# Patient Record
Sex: Female | Born: 1950 | Race: White | Hispanic: No | Marital: Married | Smoking: Never smoker
Health system: Southern US, Community
[De-identification: ages and names within clinical notes are randomized; demographics above are authoritative.]

## PROBLEM LIST (undated history)

## (undated) DIAGNOSIS — R011 Cardiac murmur, unspecified: Secondary | ICD-10-CM

## (undated) DIAGNOSIS — C4491 Basal cell carcinoma of skin, unspecified: Secondary | ICD-10-CM

## (undated) DIAGNOSIS — B019 Varicella without complication: Secondary | ICD-10-CM

## (undated) DIAGNOSIS — C439 Malignant melanoma of skin, unspecified: Secondary | ICD-10-CM

## (undated) HISTORY — DX: Basal cell carcinoma of skin, unspecified: C44.91

## (undated) HISTORY — DX: Varicella without complication: B01.9

## (undated) HISTORY — PX: OTHER SURGICAL HISTORY: SHX169

## (undated) HISTORY — PX: MOHS SURGERY: SUR867

## (undated) HISTORY — DX: Malignant melanoma of skin, unspecified: C43.9

## (undated) HISTORY — DX: Cardiac murmur, unspecified: R01.1

---
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
## 1998-12-03 ENCOUNTER — Other Ambulatory Visit: Admission: RE | Admit: 1998-12-03 | Discharge: 1998-12-03 | Payer: Self-pay | Admitting: Obstetrics and Gynecology

## 2000-02-07 ENCOUNTER — Other Ambulatory Visit: Admission: RE | Admit: 2000-02-07 | Discharge: 2000-02-07 | Payer: Self-pay | Admitting: Obstetrics and Gynecology

## 2001-02-04 ENCOUNTER — Other Ambulatory Visit: Admission: RE | Admit: 2001-02-04 | Discharge: 2001-02-04 | Payer: Self-pay | Admitting: Obstetrics and Gynecology

## 2002-03-03 ENCOUNTER — Other Ambulatory Visit: Admission: RE | Admit: 2002-03-03 | Discharge: 2002-03-03 | Payer: Self-pay | Admitting: Obstetrics and Gynecology

## 2008-04-20 ENCOUNTER — Encounter: Admission: RE | Admit: 2008-04-20 | Discharge: 2008-04-20 | Payer: Self-pay | Admitting: General Surgery

## 2009-03-26 IMAGING — US US SOFT TISSUE HEAD/NECK
1 series · 13 of 25 positions shown · non-contrast
Comparison: NONE

CLINICAL DATA: Thyromegaly. 

THYROID ULTRASOUND

[Series 1: us thyroid · 0.05mm/px · 13 of 31 slices shown]
[im 1/31]
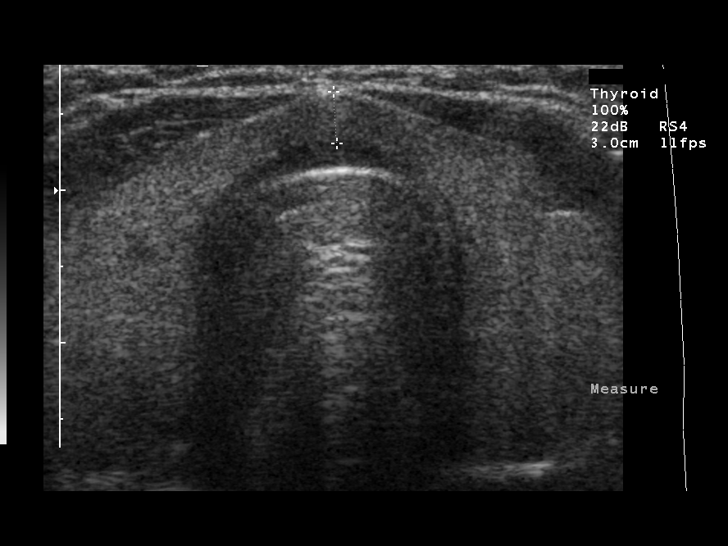
[im 3/31]
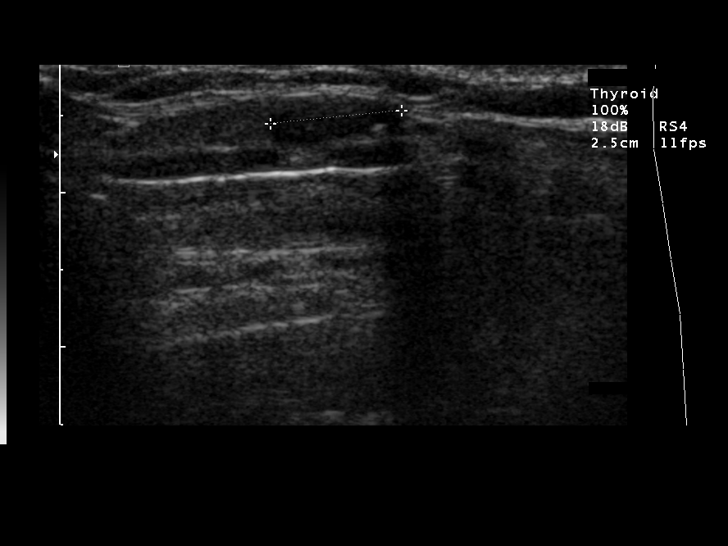
[im 6/31]
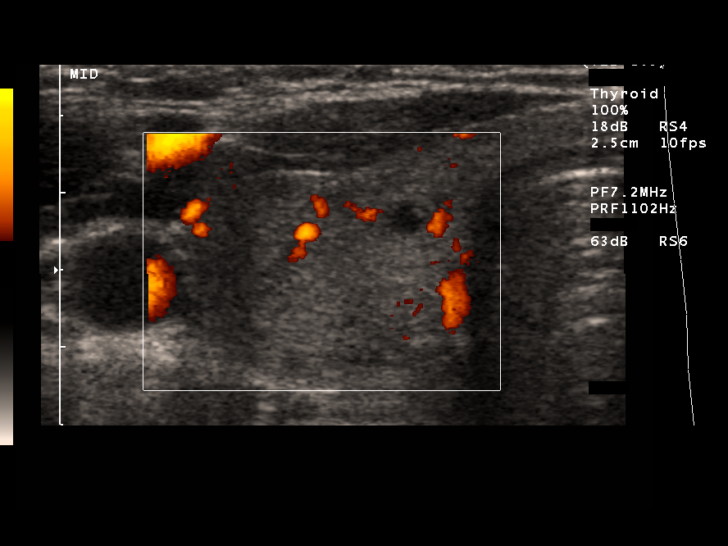
[im 8/31]
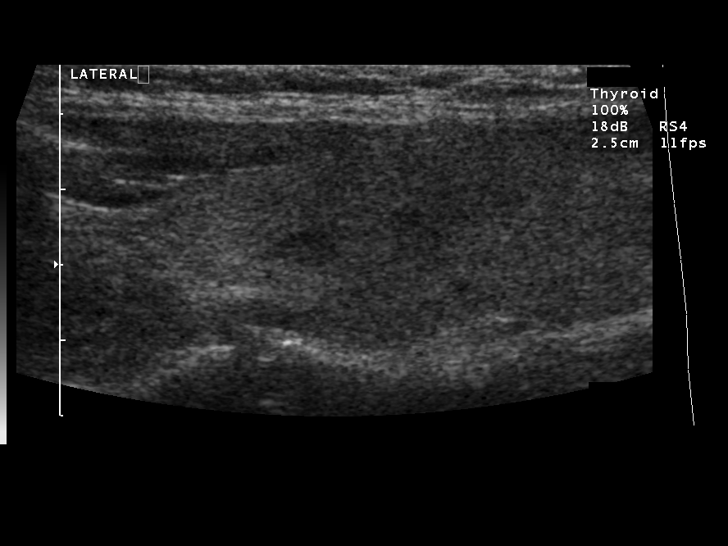
[im 11/31]
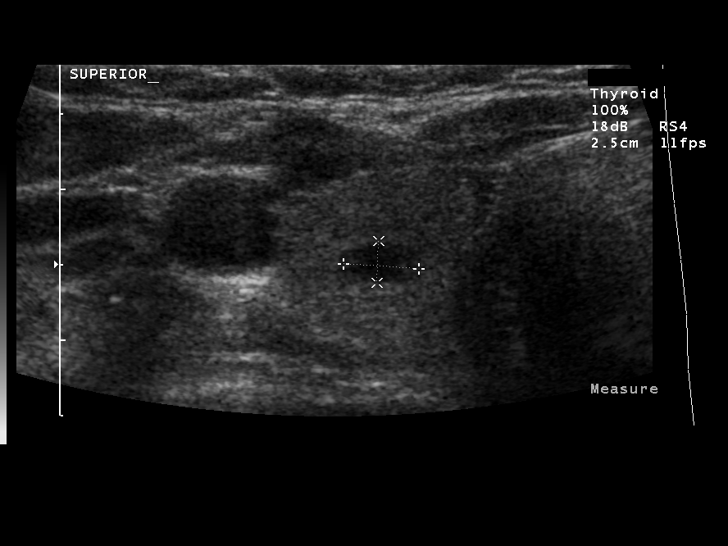
[im 13/31]
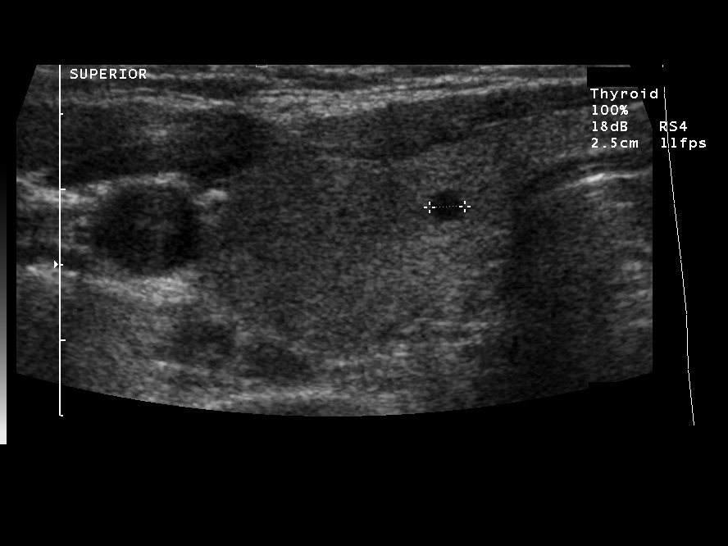
[im 16/31]
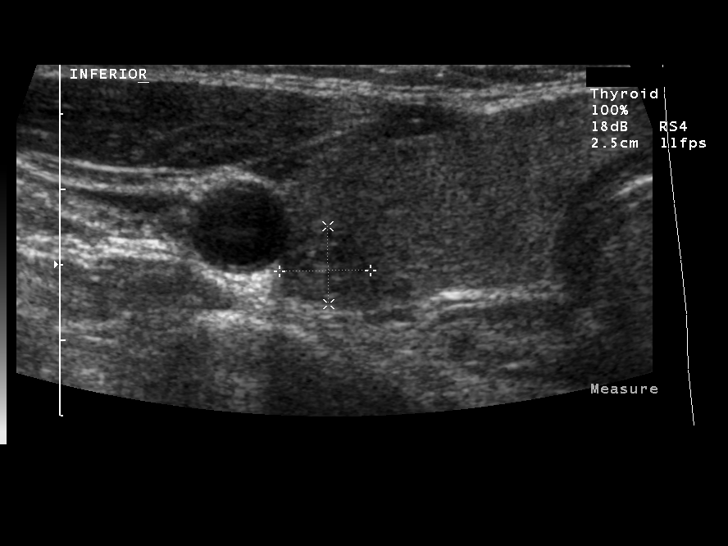
[im 18/31]
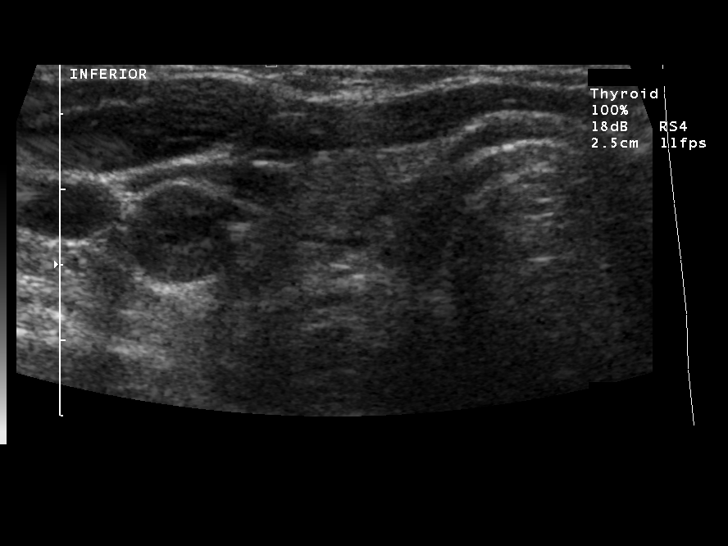
[im 21/31]
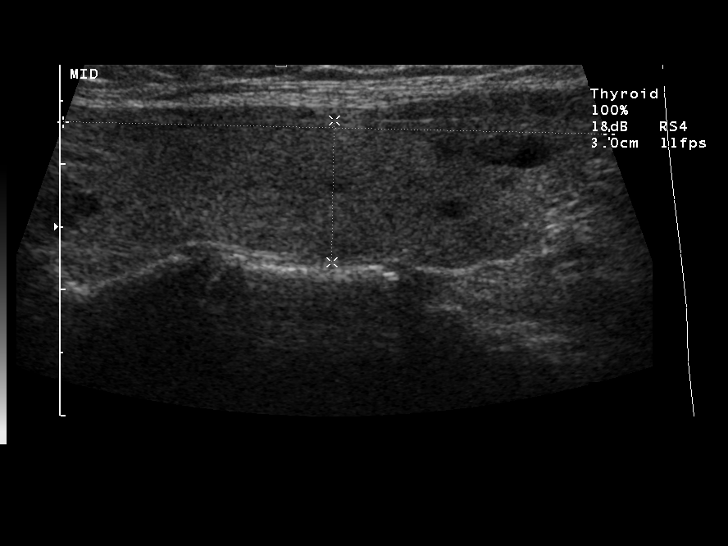
[im 23/31]
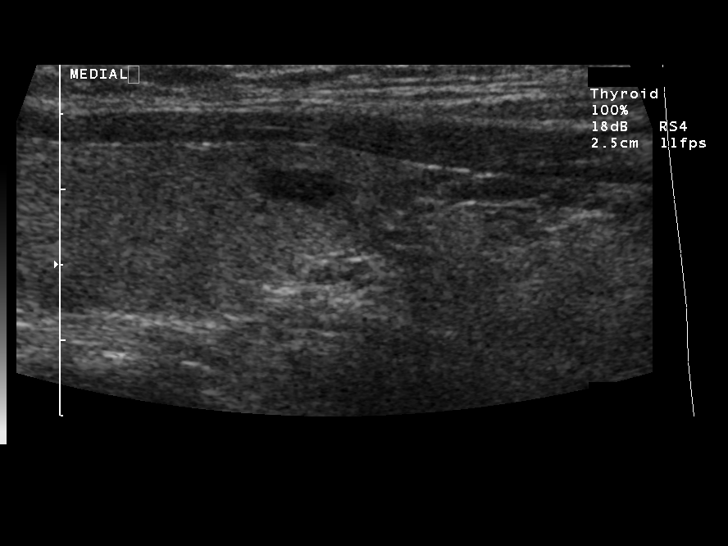
[im 26/31]
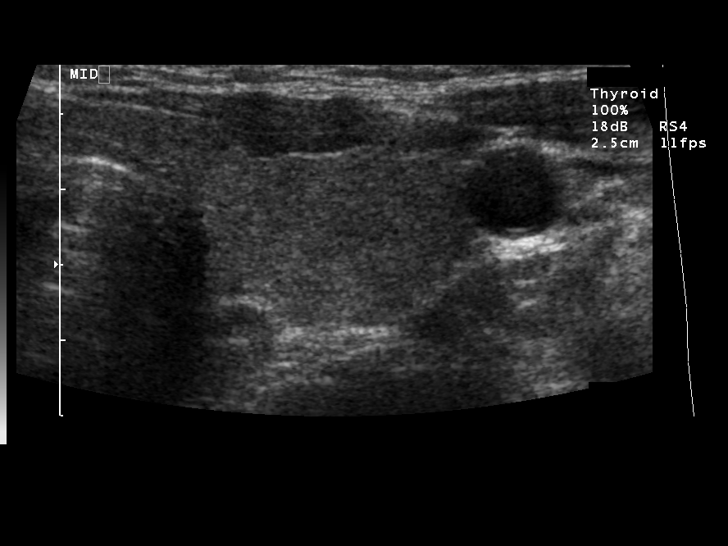
[im 28/31]
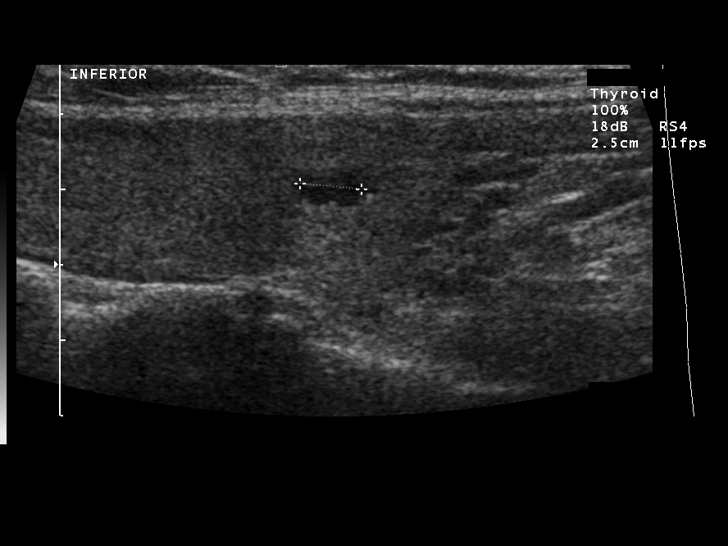
[im 31/31]
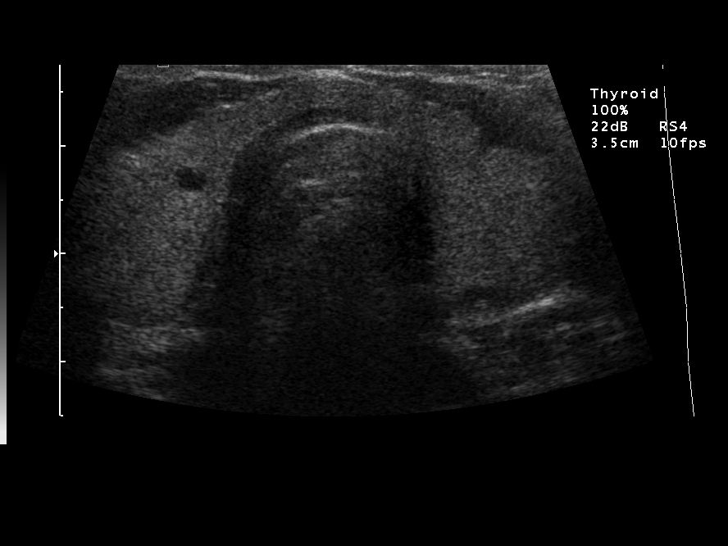

[13 of 25 positions shown; findings below may reference images not displayed]

FINDINGS RIGHT LOBE: 4.0 x 1.5 x 2.0 cm LEFT LOBE: 4.3 x 1.1 x 
1.7 cm ISTHMUS: 0.34 cm The thyroid gland is mildly enlarged. 
Located within the thyroid isthmus, there is a cyst measuring
x 0.3 x 0.6 cm. Evaluation of the right lobe demonstrates a cyst 
in the upper lobe measuring 0.5 x 0.3 x 0.4 cm. Located in the mid 
lobe, there is a hypoechoic solid nodule measuring 0.5 x 0.3 x
cm. A tiny cyst is also noted measuring 0.23 cm. Located 
inferiorly in the right lobe, there is a 0.7 x 0.6 x 0.5-cm 
nodule. This is heterogeneous and demonstrates punctate 
echogenicities suggestive of calcifications. Examination of the 
left lobe demonstrates a cyst in the inferior lobe measuring 0.5 x 
0.3 x 0.4 cm and a small hypoechoic solid nodule measuring 0.4 x 
0.3 x 0.5 cm.
IMPRESSION: Thyromegaly. Bilateral cystic and solid nodules. The 
heterogeneous solid nodule in the inferior right lobe may contain 
punctate calcifications, which increases the risk of associated 
malignancy within this nodule. If fine-needle aspiration biopsy is 
not performed, I recommend close ultrasound follow up with a 
repeat study in 3 months. Kiri Jim, M.D. electronically 
reviewed on 06/11/2007 Dict Date: 06/10/2007  Tran Date: 
06/11/2007 CAV  [REDACTED]

## 2009-07-03 IMAGING — US US SOFT TISSUE HEAD/NECK
1 series · 13 of 25 positions shown · non-contrast
Comparison: NONE

CLINICAL DATA: Follow up study dated 06/10/07. 

THYROID ULTRASOUND

[Series 1: us thyroid · 0.06mm/px · 13 of 69 slices shown]
[im 1/69]
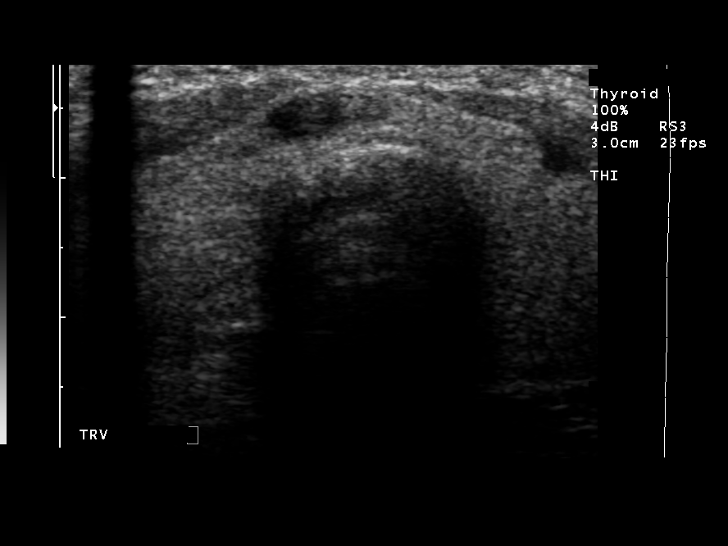
[im 6/69]
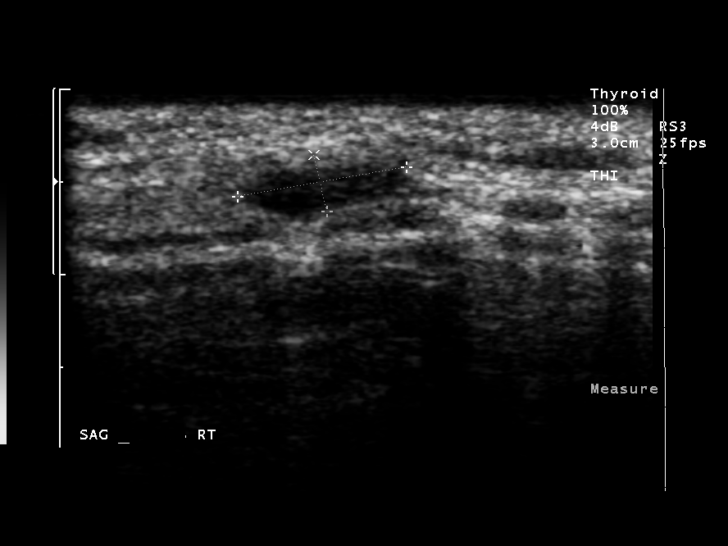
[im 12/69]
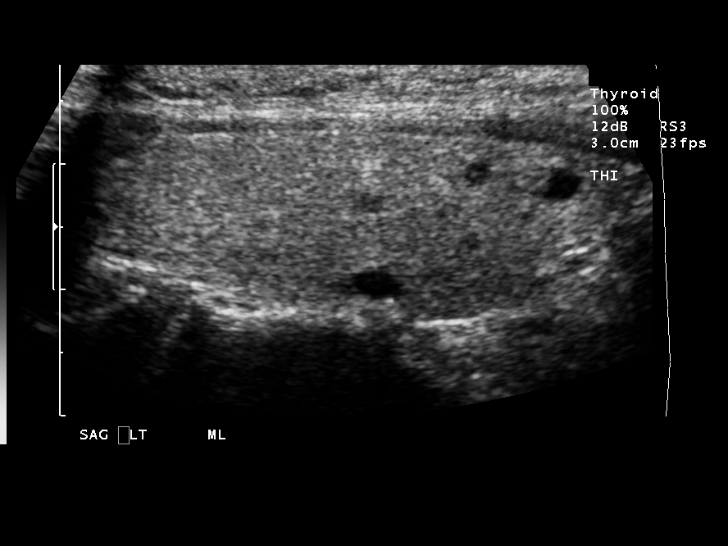
[im 18/69]
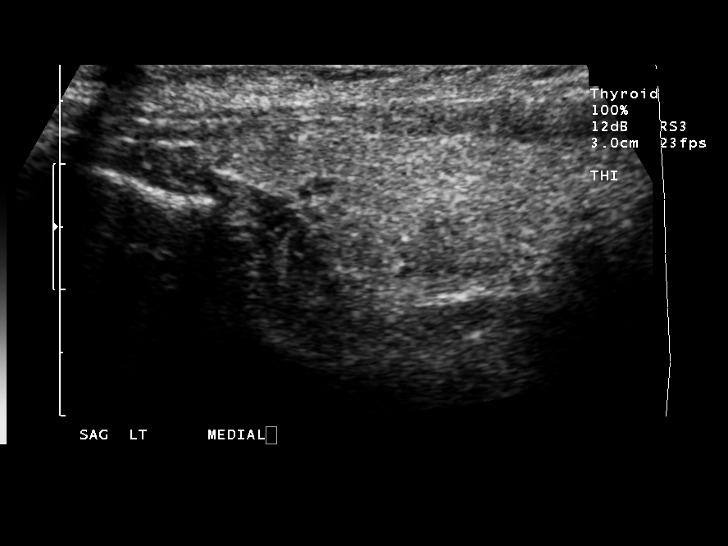
[im 23/69]
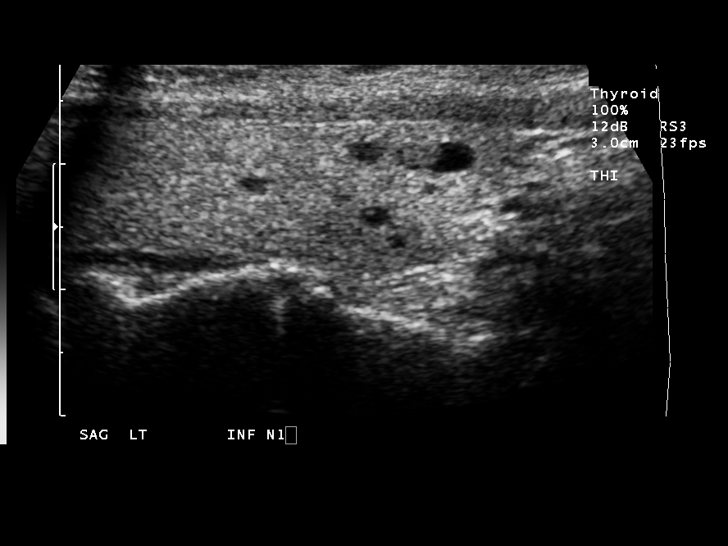
[im 29/69]
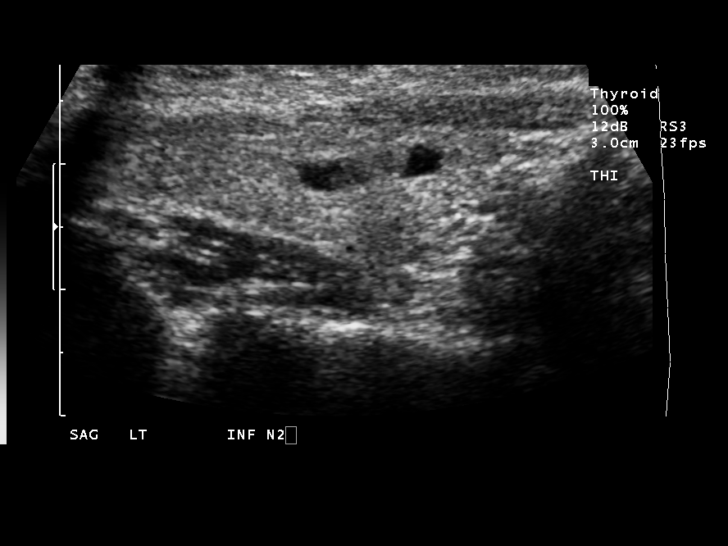
[im 35/69]
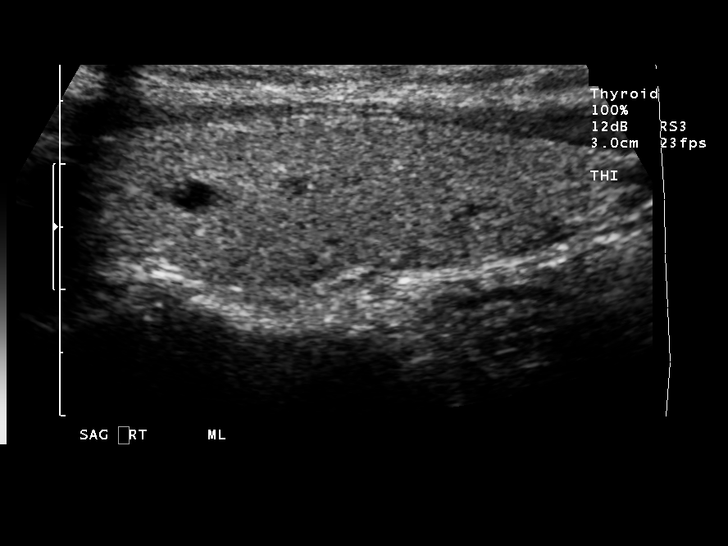
[im 40/69]
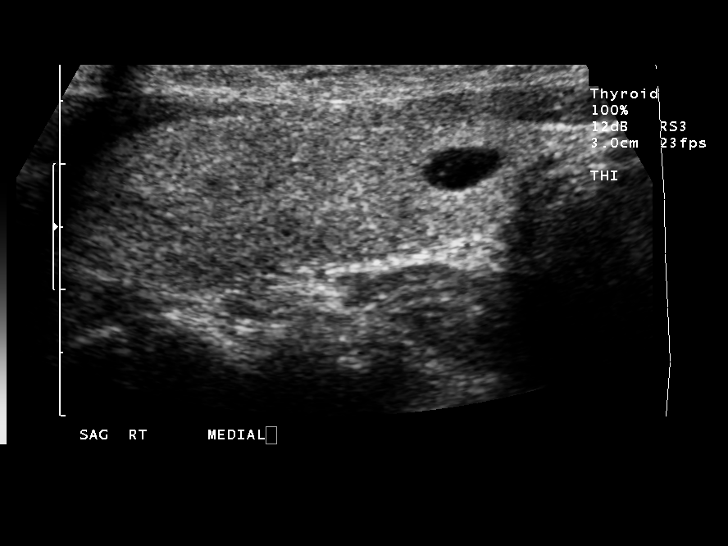
[im 46/69]
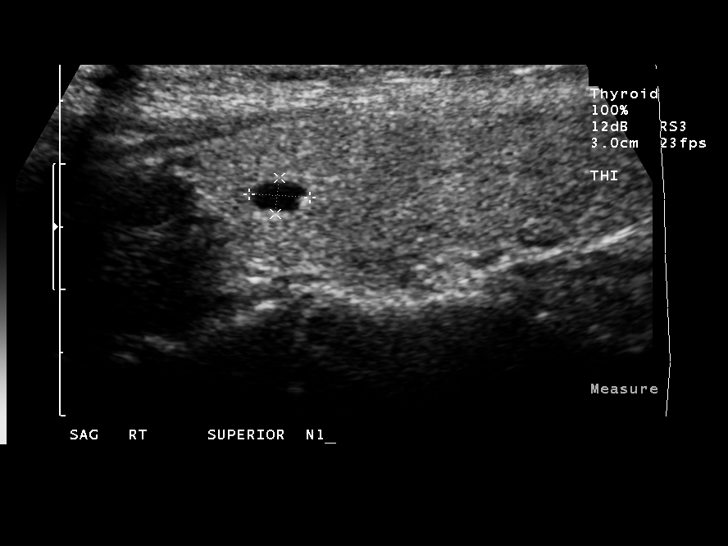
[im 52/69]
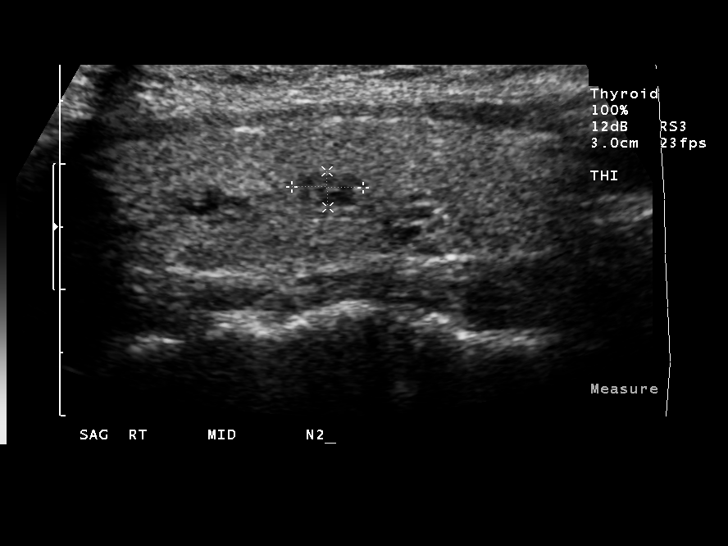
[im 57/69]
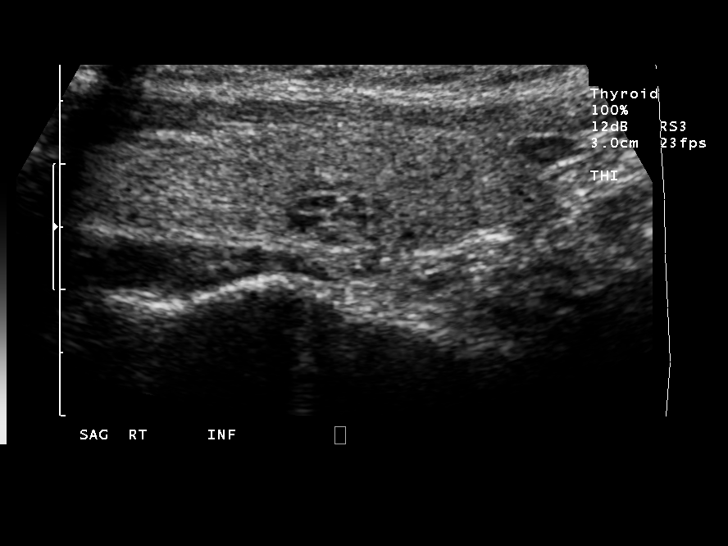
[im 63/69]
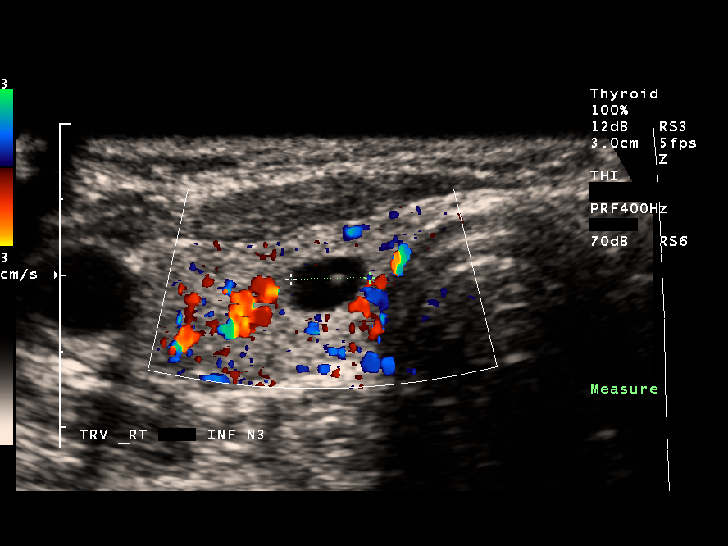
[im 69/69]
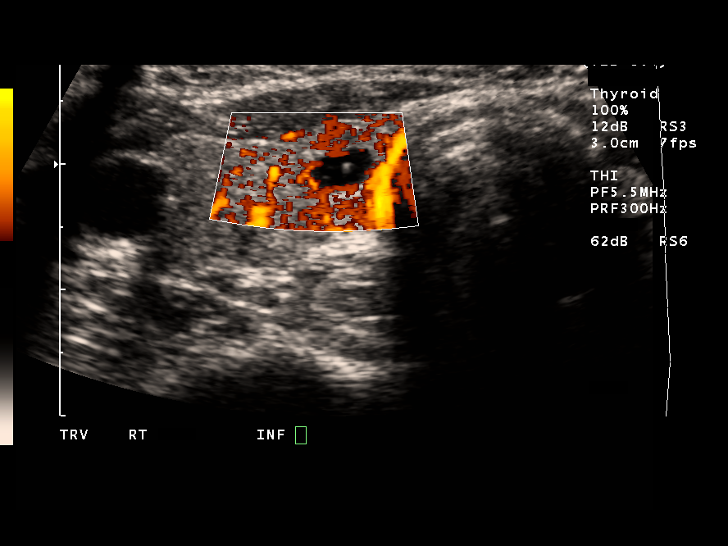

[13 of 25 positions shown; findings below may reference images not displayed]

FINDINGS: The thyroid gland remains mildly enlarged. RIGHT LOBE: 
4.3 x 1.4 x 1.9 cm Located superiorly in the right lobe is a cyst 
measuring 0.4 x 0.5 x 0.3 cm. This is stable. Located in the mid 
lobe is a heterogeneous solid nodule measuring 0.6 x 0.3 x 0.5 cm. 
This is stable. Located inferiorly is a heterogeneous solid nodule 
which may contain punctate calcifications. This measures 0.5 x
x 0.4 cm. This is stable in appearance. A colloid cyst is also 
noted in the inferior right lobe measuring 0.7 x 0.4 x 0.5 cm. 
LEFT LOBE: 4.4 x 1.5 x 2.0 cm Located in the inferior left lobe is 
0.6 x 0.2 x 0.4-cm mixed solid and cystic nodule. A second 
heterogeneous solid nodule is noted inferiorly in the left lobe, 
measuring 0.5 x 0.3 x 0.3 cm. These have not changed significantly 
in size or echo appearance. ISTHMUS: 0.4 cm A cyst at the junction 
of the right lobe and the thyroid isthmus is again noted and is 
stable in size, measuring 0.9 x 0.3 x 0.9 cm.
IMPRESSION: Stable mild thyromegaly. Stable subcentimeter-sized 
cystic and solid nodules bilaterally. The solid nodule inferiorly 
in the right lobe, which may contain punctate calcifications, is 
stable. I recommend close ultrasound follow up with repeat study 
in 6 months. Andesom Novelli, M.D. electronically reviewed on 
09/20/2007 Dict Date: 09/20/2007  Tran Date: 09/20/2007 CAV  [REDACTED]

## 2010-02-04 IMAGING — US US SOFT TISSUE HEAD/NECK
1 series · 13 of 25 positions shown · non-contrast
Comparison: Report for thyroid ultrasound from [REDACTED] dated 09/17/2007

CLINICAL DATA: 6-month follow-up thyroid nodule

THYROID ULTRASOUND
TECHNIQUE: Ultrasound examination of the thyroid gland and
adjacent soft tissues was performed.

[Series 1: us soft tissue head/neck · 0.07mm/px · 13 of 27 slices shown]
[im 1/27]
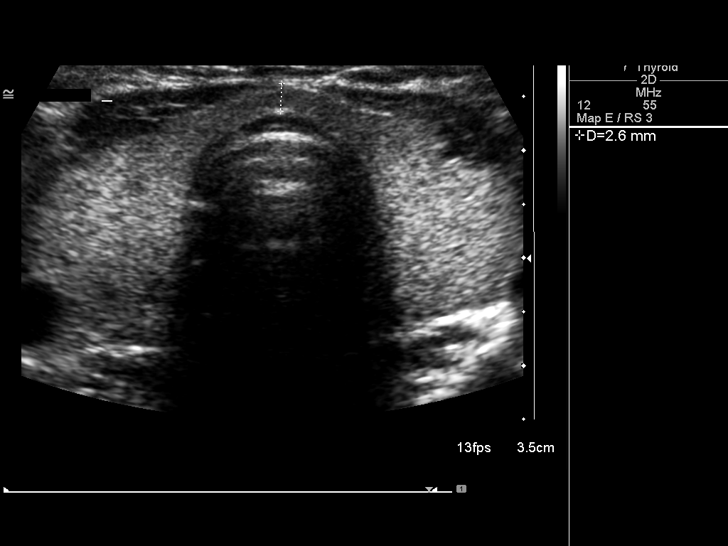
[im 3/27]
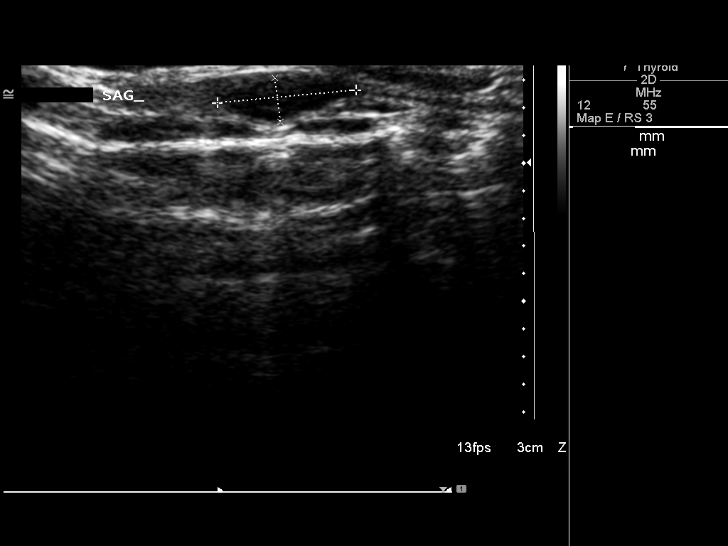
[im 5/27]
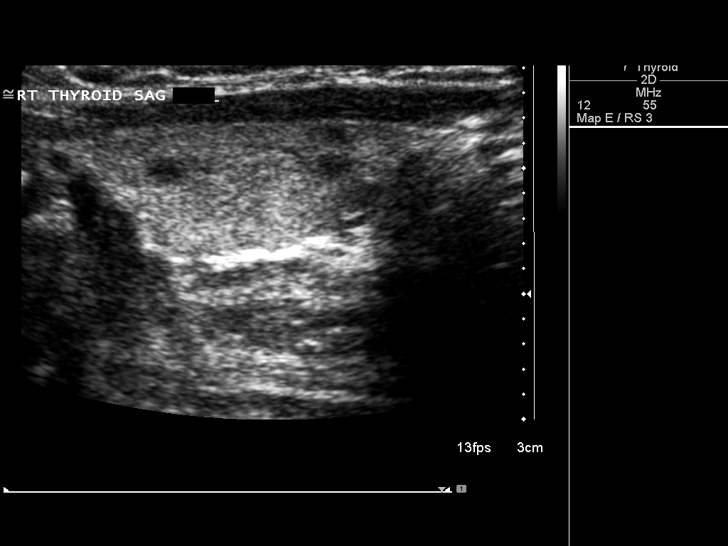
[im 7/27]
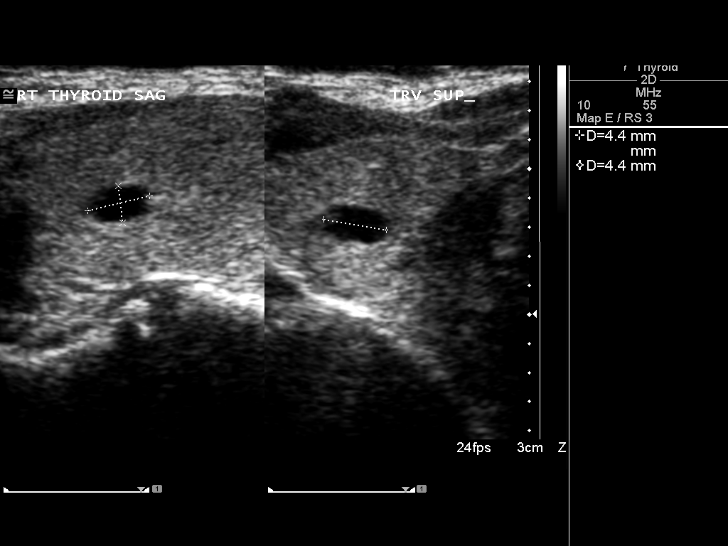
[im 9/27]
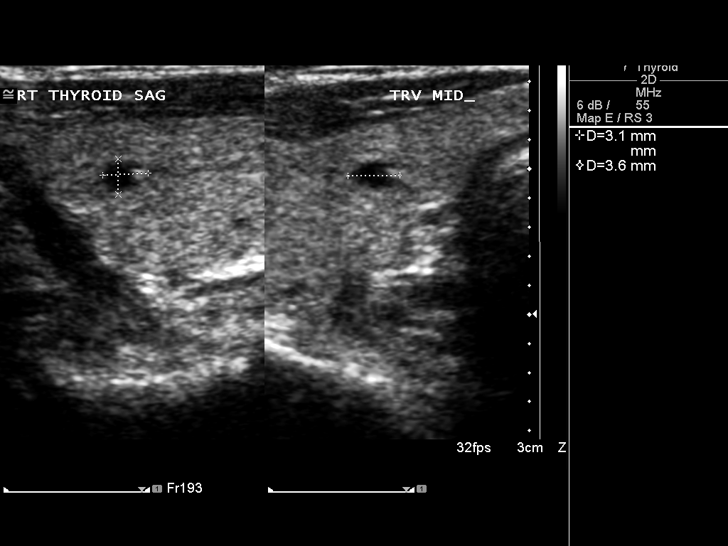
[im 11/27]
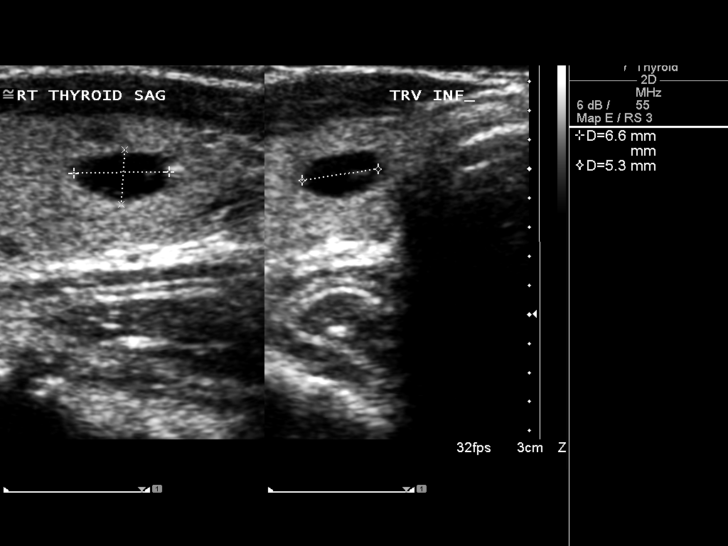
[im 14/27]
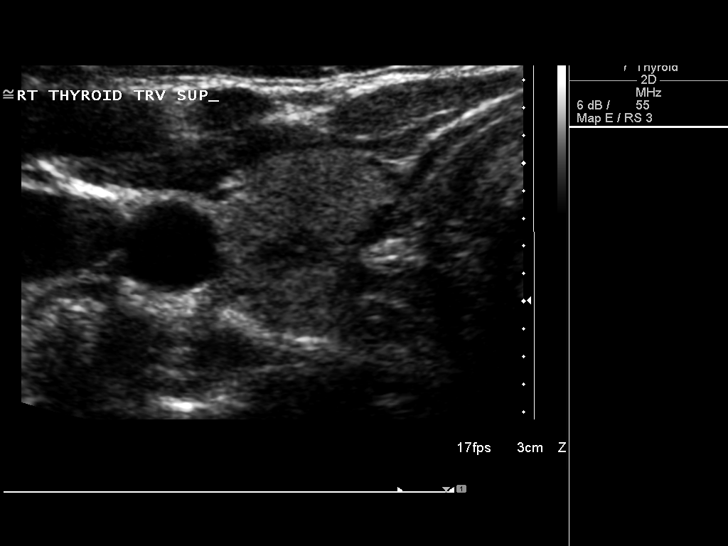
[im 16/27]
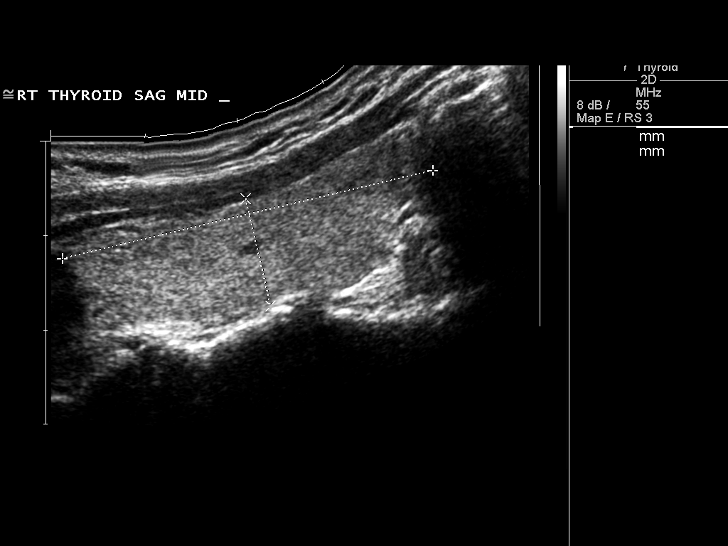
[im 18/27]
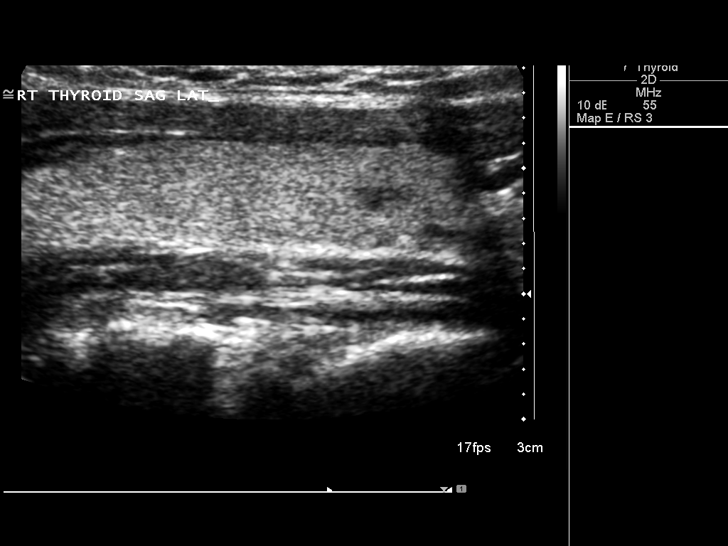
[im 20/27]
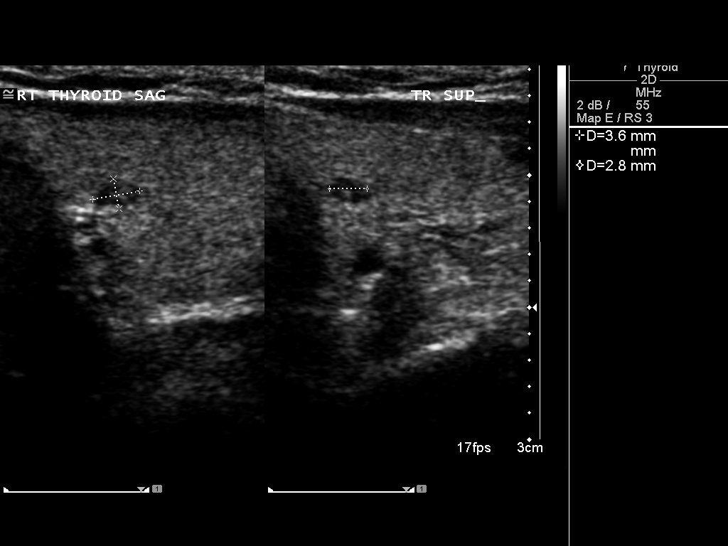
[im 22/27]
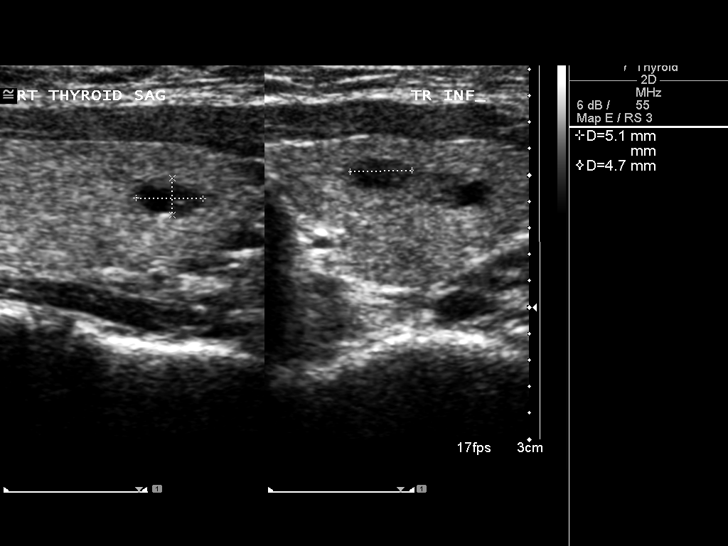
[im 24/27]
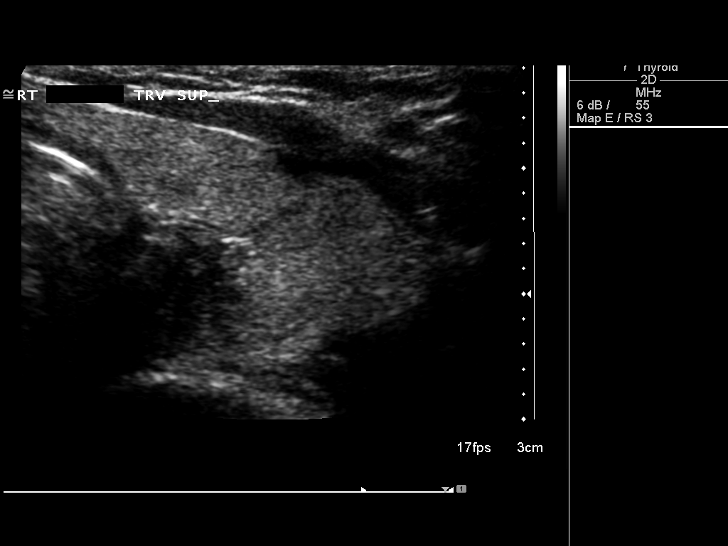
[im 27/27]
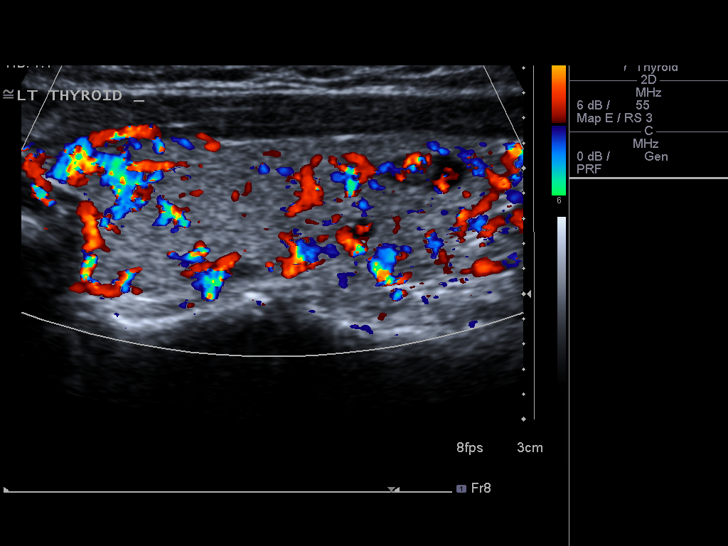

[13 of 25 positions shown; findings below may reference images not displayed]

FINDINGS: Right thyroid lobe measures 4.0 cm in length and 1.1 x
2.0 cm and transverse dimensions.  The left lobe measures 4.0 cm in
length and 1.2 x 1.9 cm transversely.  The thyroid isthmus measures
2.6 mm in thickness.  Diffuse inhomogeneity of thyroid echotexture.
4 x 3 x 4 mm cystic lesion in the superior pole of the right
thyroid lobe, unchanged by prior report.  6 x 3 x 4 mm solid nodule
in the mid aspect of the right thyroid lobe also approximately the
same by report.  6 x 4 x 7 mm solid nodule in the inferior pole of
the the right thyroid lobe, stable.  Cystic lesion in the inferior
right lobe measuring 7 x 4 x 5 mm, the same measurement as given
previously.  Nodule in the inferior aspect of thyroid isthmus
measuring 10 x 3 x 10 mm, stable per report.  4 x 2 x 3 mm solid
nodule medial aspect of superior pole of the left thyroid lobe.  No
nodule reported at that site on the prior exam.  5 x 3 x 4 mm
cystic nodule medial aspect of the inferior left thyroid lobe, not
reported previously.  Solid nodule inferior aspect left thyroid
lobe measuring 5 x 3 x 5 mm, stable by report.  Complex nodule
inferior pole left thyroid lobe measuring 6 x 4 x 5 mm.  This was
not reported previously.
IMPRESSION: Multinodular goiter.  Multiple small nodules, some solid and some
cystic (colloid cysts).  There are a few left thyroid nodules which
were not reported previously and therefore sent that they represent
a new finding.  I detect no nodules at this time which I strongly
feel need to undergo biopsy.

## 2013-06-19 LAB — HM DEXA SCAN

## 2015-01-23 ENCOUNTER — Ambulatory Visit (INDEPENDENT_AMBULATORY_CARE_PROVIDER_SITE_OTHER): Payer: BLUE CROSS/BLUE SHIELD | Admitting: Adult Health

## 2015-01-23 ENCOUNTER — Other Ambulatory Visit: Payer: Self-pay | Admitting: Adult Health

## 2015-01-23 ENCOUNTER — Encounter: Payer: Self-pay | Admitting: Adult Health

## 2015-01-23 VITALS — BP 120/86 | Temp 98.5°F | Wt 146.6 lb

## 2015-01-23 DIAGNOSIS — R011 Cardiac murmur, unspecified: Secondary | ICD-10-CM | POA: Diagnosis not present

## 2015-01-23 DIAGNOSIS — R8290 Unspecified abnormal findings in urine: Secondary | ICD-10-CM | POA: Diagnosis not present

## 2015-01-23 DIAGNOSIS — M810 Age-related osteoporosis without current pathological fracture: Secondary | ICD-10-CM | POA: Insufficient documentation

## 2015-01-23 DIAGNOSIS — Z0000 Encounter for general adult medical examination without abnormal findings: Secondary | ICD-10-CM

## 2015-01-23 LAB — HEPATIC FUNCTION PANEL
ALBUMIN: 4.4 g/dL (ref 3.5–5.2)
ALT: 16 U/L (ref 0–35)
AST: 20 U/L (ref 0–37)
Alkaline Phosphatase: 67 U/L (ref 39–117)
Bilirubin, Direct: 0.1 mg/dL (ref 0.0–0.3)
TOTAL PROTEIN: 7.4 g/dL (ref 6.0–8.3)
Total Bilirubin: 0.6 mg/dL (ref 0.2–1.2)

## 2015-01-23 LAB — CBC WITH DIFFERENTIAL/PLATELET
BASOS PCT: 0.6 % (ref 0.0–3.0)
Basophils Absolute: 0 10*3/uL (ref 0.0–0.1)
Eosinophils Absolute: 0.1 10*3/uL (ref 0.0–0.7)
Eosinophils Relative: 1.5 % (ref 0.0–5.0)
HEMATOCRIT: 42.2 % (ref 36.0–46.0)
Hemoglobin: 14.3 g/dL (ref 12.0–15.0)
LYMPHS PCT: 35.2 % (ref 12.0–46.0)
Lymphs Abs: 2.1 10*3/uL (ref 0.7–4.0)
MCHC: 33.8 g/dL (ref 30.0–36.0)
MCV: 94.7 fl (ref 78.0–100.0)
MONOS PCT: 7.2 % (ref 3.0–12.0)
Monocytes Absolute: 0.4 10*3/uL (ref 0.1–1.0)
NEUTROS ABS: 3.3 10*3/uL (ref 1.4–7.7)
Neutrophils Relative %: 55.5 % (ref 43.0–77.0)
PLATELETS: 306 10*3/uL (ref 150.0–400.0)
RBC: 4.46 Mil/uL (ref 3.87–5.11)
RDW: 13.8 % (ref 11.5–15.5)
WBC: 5.9 10*3/uL (ref 4.0–10.5)

## 2015-01-23 LAB — LIPID PANEL
CHOL/HDL RATIO: 3
CHOLESTEROL: 212 mg/dL — AB (ref 0–200)
HDL: 81.8 mg/dL (ref >39.00)
LDL Cholesterol: 121 mg/dL — ABNORMAL HIGH (ref 0–99)
NonHDL: 130.64
TRIGLYCERIDES: 50 mg/dL (ref 0.0–149.0)
VLDL: 10 mg/dL (ref 0.0–40.0)

## 2015-01-23 LAB — POCT URINALYSIS DIPSTICK
Bilirubin, UA: NEGATIVE
GLUCOSE UA: NEGATIVE
Ketones, UA: NEGATIVE
NITRITE UA: NEGATIVE
Protein, UA: NEGATIVE
RBC UA: NEGATIVE
Spec Grav, UA: 1.015
UROBILINOGEN UA: 0.2
pH, UA: 7

## 2015-01-23 LAB — BASIC METABOLIC PANEL
BUN: 15 mg/dL (ref 6–23)
CHLORIDE: 102 meq/L (ref 96–112)
CO2: 29 meq/L (ref 19–32)
Calcium: 9.7 mg/dL (ref 8.4–10.5)
Creatinine, Ser: 0.85 mg/dL (ref 0.40–1.20)
GFR: 71.44 mL/min (ref >60.00)
GLUCOSE: 101 mg/dL — AB (ref 70–99)
Potassium: 5.2 mEq/L — ABNORMAL HIGH (ref 3.5–5.1)
SODIUM: 139 meq/L (ref 135–145)

## 2015-01-23 LAB — HEMOGLOBIN A1C: Hgb A1c MFr Bld: 5.6 % (ref 4.6–6.5)

## 2015-01-23 LAB — TSH: TSH: 1.12 u[IU]/mL (ref 0.35–4.50)

## 2015-01-23 NOTE — Addendum Note (Signed)
Addended by: Elmer Picker on: 01/23/2015 04:06 PM   Modules accepted: Orders

## 2015-01-23 NOTE — Progress Notes (Signed)
HPI:  Dawn Franco is here for her complete physical exam.    Last PCP and physical: 2013 Immunizations: Will get [place] vaccination schedules Diet:Eats healthy Exercise: Goes to the [facility], walks  Colonoscopy: Is due, but she does not want to go back.  Dexa: Within two years.  Pap Smear: [facility], 01/2015 Mammogram: 01/2015  Followed by Dermatology and last saw 11/2014 Dentist: Twice yearly Eye: Needs to go.    Has the following chronic problems that require follow up and concerns today:  None   ROS negative for unless reported above: fevers, chills,feeling poorly, unintentional weight loss, hearing or vision loss, chest pain, palpitations, leg claudication, struggling to breath,Not feeling congested in the chest, no orthopenia, no cough,no wheezing, normal appetite, no soft tissue swelling, no hemoptysis, melena, hematochezia, hematuria, falls, loc, si, or thoughts of self harm.    Past Medical History  Diagnosis Date  . Melanoma     left arm and forehead  . Chicken pox   . Heart murmur     Mtral Valve    Past Surgical History  Procedure Laterality Date  . Tonsillectomy and adenoidectomy  1957  . Mohs surgery    . Local kin cancer removal      Family History  Problem Relation Age of Onset  . Hypertension Mother     Social History   Social History  . Marital Status: Married    Spouse Name: N/A  . Number of Children: N/A  . Years of Education: N/A   Social History Main Topics  . Smoking status: Never Smoker   . Smokeless tobacco: None  . Alcohol Use: 0.0 oz/week    0 Standard drinks or equivalent per week     Comment: Wine with meals  . Drug Use: None  . Sexual Activity: Not Asked   Other Topics Concern  . None   Social History Narrative   She does not work    Married 37 years   Two children both are local ( Son 46, Daughter 72)     Current outpatient prescriptions:  .  Cholecalciferol (VITAMIN D3) 2000 UNITS capsule, Take  2,000 Units by mouth daily., Disp: , Rfl:  .  Omega-3 Fatty Acids (FISH OIL) 1200 MG CAPS, Take by mouth., Disp: , Rfl:   EXAM:  Filed Vitals:   01/23/15 1016  BP: 120/86  Temp: 98.5 F (36.9 C)    There is no height on file to calculate BMI.  GENERAL: vitals reviewed and listed above, alert, oriented, appears well hydrated and in no acute distress  HEENT: atraumatic, conjunttiva clear, no obvious abnormalities on inspection of external nose and ears  NECK: Neck is soft and supple without masses, no adenopathy or thyromegaly, trachea midline, no JVD. Normal range of motion.   LUNGS: clear to auscultation bilaterally, no wheezes, rales or rhonchi, good air movement  CV: Regular rate and rhythm, normal I9/S8, mid systolic click, No gallops, or rubs. No carotid bruit and no peripheral edema.   MS: moves all extremities without noticeable abnormality. No edema noted  Abd: soft/nontender/nondistended/normal bowel sounds   Skin: warm and dry, no rash. Scattered keratosis and nevi. No concerning items during this visit.   Extremities: No clubbing, cyanosis, or edema. Capillary refill is WNL. Pulses intact bilaterally in upper and lower extremities.   Neuro: CN II-XII intact, sensation and reflexes normal throughout, 5/5 muscle strength in bilateral upper and lower extremities. Normal finger to nose. Normal rapid alternating movements. Normal  romberg. No pronator drift.   PSYCH: pleasant and cooperative, no obvious depression or anxiety  ASSESSMENT AND PLAN: 1. 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 -We provided refills  for any medications we will prescribe as needed. -We addressed current concerns per orders and patient instructions. -We have asked for records for pertinent exams, studies, vaccines and notes from previous providers. -We have advised patient to follow up per instructions below.   -Patient advised to return or notify a provider immediately if symptoms worsen or persist or new concerns arise.  There are no Patient Instructions on file for this visit.   [employer]

## 2015-01-23 NOTE — Patient Instructions (Addendum)
It was great meeting you and welcome to the team!  I will follow up with you regarding your blood work.   You can come back in one year for a complete physical or sooner if needed.   Health Maintenance Adopting a healthy lifestyle and getting preventive care can go a long way to promote health and wellness. Talk with your health care provider about what schedule of regular examinations is right for you. This is a good chance for you to check in with your provider about disease prevention and staying healthy. In between checkups, there are plenty of things you can do on your own. Experts have done a lot of research about which lifestyle changes and preventive measures are most likely to keep you healthy. Ask your health care provider for more information. WEIGHT AND DIET  Eat a healthy diet  Be sure to include plenty of vegetables, fruits, low-fat dairy products, and lean protein.  Do not eat a lot of foods high in solid fats, added sugars, or salt.  Get regular exercise. This is one of the most important things you can do for your health.  Most adults should exercise for at least 150 minutes each week. The exercise should increase your heart rate and make you sweat (moderate-intensity exercise).  Most adults should also do strengthening exercises at least twice a week. This is in addition to the moderate-intensity exercise.  Maintain a healthy weight  Body mass index (BMI) is a measurement that can be used to identify possible weight problems. It estimates body fat based on height and weight. Your health care provider can help determine your BMI and help you achieve or maintain a healthy weight.  For females 63 years of age and older:   A BMI below 18.5 is considered underweight.  A BMI of 18.5 to 24.9 is normal.  A BMI of 25 to 29.9 is considered overweight.  A BMI of 30 and above is considered obese.  Watch levels of cholesterol and blood lipids  You should start having your  blood tested for lipids and cholesterol at 64 years of age, then have this test every 5 years.  You may need to have your cholesterol levels checked more often if:  Your lipid or cholesterol levels are high.  You are older than 64 years of age.  You are at high risk for heart disease.  CANCER SCREENING   Lung Cancer  Lung cancer screening is recommended for adults 80-10 years old who are at high risk for lung cancer because of a history of smoking.  A yearly low-dose CT scan of the lungs is recommended for people who:  Currently smoke.  Have quit within the past 15 years.  Have at least a 30-pack-year history of smoking. A pack year is smoking an average of one pack of cigarettes a day for 1 year.  Yearly screening should continue until it has been 15 years since you quit.  Yearly screening should stop if you develop a health problem that would prevent you from having lung cancer treatment.  Breast Cancer  Practice breast self-awareness. This means understanding how your breasts normally appear and feel.  It also means doing regular breast self-exams. Let your health care provider know about any changes, no matter how small.  If you are in your 20s or 30s, you should have a clinical breast exam (CBE) by a health care provider every 1-3 years as part of a regular health exam.  If you are  40 or older, have a CBE every year. Also consider having a breast X-ray (mammogram) every year.  If you have a family history of breast cancer, talk to your health care provider about genetic screening.  If you are at high risk for breast cancer, talk to your health care provider about having an MRI and a mammogram every year.  Breast cancer gene (BRCA) assessment is recommended for women who have family members with BRCA-related cancers. BRCA-related cancers include:  Breast.  Ovarian.  Tubal.  Peritoneal cancers.  Results of the assessment will determine the need for genetic  counseling and BRCA1 and BRCA2 testing. Cervical Cancer Routine pelvic examinations to screen for cervical cancer are no longer recommended for nonpregnant women who are considered low risk for cancer of the pelvic organs (ovaries, uterus, and vagina) and who do not have symptoms. A pelvic examination may be necessary if you have symptoms including those associated with pelvic infections. Ask your health care provider if a screening pelvic exam is right for you.   The Pap test is the screening test for cervical cancer for women who are considered at risk.  If you had a hysterectomy for a problem that was not cancer or a condition that could lead to cancer, then you no longer need Pap tests.  If you are older than 65 years, and you have had normal Pap tests for the past 10 years, you no longer need to have Pap tests.  If you have had past treatment for cervical cancer or a condition that could lead to cancer, you need Pap tests and screening for cancer for at least 20 years after your treatment.  If you no longer get a Pap test, assess your risk factors if they change (such as having a new sexual partner). This can affect whether you should start being screened again.  Some women have medical problems that increase their chance of getting cervical cancer. If this is the case for you, your health care provider may recommend more frequent screening and Pap tests.  The human papillomavirus (HPV) test is another test that may be used for cervical cancer screening. The HPV test looks for the virus that can cause cell changes in the cervix. The cells collected during the Pap test can be tested for HPV.  The HPV test can be used to screen women 20 years of age and older. Getting tested for HPV can extend the interval between normal Pap tests from three to five years.  An HPV test also should be used to screen women of any age who have unclear Pap test results.  After 64 years of age, women should have  HPV testing as often as Pap tests.  Colorectal Cancer  This type of cancer can be detected and often prevented.  Routine colorectal cancer screening usually begins at 64 years of age and continues through 64 years of age.  Your health care provider may recommend screening at an earlier age if you have risk factors for colon cancer.  Your health care provider may also recommend using home test kits to check for hidden blood in the stool.  A small camera at the end of a tube can be used to examine your colon directly (sigmoidoscopy or colonoscopy). This is done to check for the earliest forms of colorectal cancer.  Routine screening usually begins at age 28.  Direct examination of the colon should be repeated every 5-10 years through 64 years of age. However, you may need  to be screened more often if early forms of precancerous polyps or small growths are found. Skin Cancer  Check your skin from head to toe regularly.  Tell your health care provider about any new moles or changes in moles, especially if there is a change in a mole's shape or color.  Also tell your health care provider if you have a mole that is larger than the size of a pencil eraser.  Always use sunscreen. Apply sunscreen liberally and repeatedly throughout the day.  Protect yourself by wearing long sleeves, pants, a wide-brimmed hat, and sunglasses whenever you are outside. HEART DISEASE, DIABETES, AND HIGH BLOOD PRESSURE   Have your blood pressure checked at least every 1-2 years. High blood pressure causes heart disease and increases the risk of stroke.  If you are between 15 years and 75 years old, ask your health care provider if you should take aspirin to prevent strokes.  Have regular diabetes screenings. This involves taking a blood sample to check your fasting blood sugar level.  If you are at a normal weight and have a low risk for diabetes, have this test once every three years after 64 years of  age.  If you are overweight and have a high risk for diabetes, consider being tested at a younger age or more often. PREVENTING INFECTION  Hepatitis B  If you have a higher risk for hepatitis B, you should be screened for this virus. You are considered at high risk for hepatitis B if:  You were born in a country where hepatitis B is common. Ask your health care provider which countries are considered high risk.  Your parents were born in a high-risk country, and you have not been immunized against hepatitis B (hepatitis B vaccine).  You have HIV or AIDS.  You use needles to inject street drugs.  You live with someone who has hepatitis B.  You have had sex with someone who has hepatitis B.  You get hemodialysis treatment.  You take certain medicines for conditions, including cancer, organ transplantation, and autoimmune conditions. Hepatitis C  Blood testing is recommended for:  Everyone born from [birth year] through 1965.  Anyone with known risk factors for hepatitis C. Sexually transmitted infections (STIs)  You should be screened for sexually transmitted infections (STIs) including gonorrhea and chlamydia if:  You are sexually active and are younger than 64 years of age.  You are older than 64 years of age and your health care provider tells you that you are at risk for this type of infection.  Your sexual activity has changed since you were last screened and you are at an increased risk for chlamydia or gonorrhea. Ask your health care provider if you are at risk.  If you do not have HIV, but are at risk, it may be recommended that you take a prescription medicine daily to prevent HIV infection. This is called pre-exposure prophylaxis (PrEP). You are considered at risk if:  You are sexually active and do not regularly use condoms or know the HIV status of your partner(s).  You take drugs by injection.  You are sexually active with a partner who has HIV. Talk with your health  care provider about whether you are at high risk of being infected with HIV. If you choose to begin PrEP, you should first be tested for HIV. You should then be tested every 3 months for as long as you are taking PrEP.  PREGNANCY   If you are premenopausal  and you may become pregnant, ask your health care provider about preconception counseling.  If you may become pregnant, take 400 to 800 micrograms (mcg) of folic acid every day.  If you want to prevent pregnancy, talk to your health care provider about birth control (contraception). OSTEOPOROSIS AND MENOPAUSE   Osteoporosis is a disease in which the bones lose minerals and strength with aging. This can result in serious bone fractures. Your risk for osteoporosis can be identified using a bone density scan.  If you are 34 years of age or older, or if you are at risk for osteoporosis and fractures, ask your health care provider if you should be screened.  Ask your health care provider whether you should take a calcium or vitamin D supplement to lower your risk for osteoporosis.  Menopause may have certain physical symptoms and risks.  Hormone replacement therapy may reduce some of these symptoms and risks. Talk to your health care provider about whether hormone replacement therapy is right for you.  HOME CARE INSTRUCTIONS   Schedule regular health, dental, and eye exams.  Stay current with your immunizations.   Do not use any tobacco products including cigarettes, chewing tobacco, or electronic cigarettes.  If you are pregnant, do not drink alcohol.  If you are breastfeeding, limit how much and how often you drink alcohol.  Limit alcohol intake to no more than 1 drink per day for nonpregnant women. One drink equals 12 ounces of beer, 5 ounces of wine, or 1 ounces of hard liquor.  Do not use street drugs.  Do not share needles.  Ask your health care provider for help if you need support or information about quitting  drugs.  Tell your health care provider if you often feel depressed.  Tell your health care provider if you have ever been abused or do not feel safe at home. Document Released: 11/11/2010 Document Revised: 09/12/2013 Document Reviewed: 03/30/2013 [facility] Patient Information 2015 [place], [place]. This information is not intended to replace advice given to you by your health care provider. Make sure you discuss any questions you have with your health care provider.

## 2015-01-25 LAB — URINE CULTURE: Colony Count: 40000

## 2015-02-08 ENCOUNTER — Telehealth: Payer: Self-pay | Admitting: *Deleted

## 2015-02-08 NOTE — Telephone Encounter (Signed)
Patient called back returning your call. [phone].

## 2015-02-09 NOTE — Telephone Encounter (Signed)
Called and spoke with pt and pt is aware of lab results.

## 2015-03-23 ENCOUNTER — Ambulatory Visit (INDEPENDENT_AMBULATORY_CARE_PROVIDER_SITE_OTHER): Payer: BLUE CROSS/BLUE SHIELD | Admitting: Psychology

## 2015-03-23 DIAGNOSIS — F411 Generalized anxiety disorder: Secondary | ICD-10-CM | POA: Diagnosis not present

## 2015-03-23 DIAGNOSIS — Z630 Problems in relationship with spouse or partner: Secondary | ICD-10-CM | POA: Diagnosis not present

## 2015-04-03 ENCOUNTER — Ambulatory Visit (INDEPENDENT_AMBULATORY_CARE_PROVIDER_SITE_OTHER): Payer: BLUE CROSS/BLUE SHIELD | Admitting: Psychology

## 2015-04-03 DIAGNOSIS — F4320 Adjustment disorder, unspecified: Secondary | ICD-10-CM

## 2015-04-09 ENCOUNTER — Ambulatory Visit (INDEPENDENT_AMBULATORY_CARE_PROVIDER_SITE_OTHER): Payer: BLUE CROSS/BLUE SHIELD | Admitting: Psychology

## 2015-04-09 DIAGNOSIS — Z630 Problems in relationship with spouse or partner: Secondary | ICD-10-CM | POA: Diagnosis not present

## 2015-04-09 DIAGNOSIS — F411 Generalized anxiety disorder: Secondary | ICD-10-CM

## 2015-04-18 ENCOUNTER — Ambulatory Visit (INDEPENDENT_AMBULATORY_CARE_PROVIDER_SITE_OTHER): Payer: BLUE CROSS/BLUE SHIELD | Admitting: Psychology

## 2015-04-18 DIAGNOSIS — Z630 Problems in relationship with spouse or partner: Secondary | ICD-10-CM

## 2015-04-18 DIAGNOSIS — F411 Generalized anxiety disorder: Secondary | ICD-10-CM | POA: Diagnosis not present

## 2015-06-25 ENCOUNTER — Ambulatory Visit (INDEPENDENT_AMBULATORY_CARE_PROVIDER_SITE_OTHER): Payer: PPO | Admitting: Adult Health

## 2015-06-25 ENCOUNTER — Encounter: Payer: Self-pay | Admitting: Adult Health

## 2015-06-25 VITALS — BP 130/90 | Temp 98.6°F | Ht 65.0 in | Wt 150.9 lb

## 2015-06-25 DIAGNOSIS — H6591 Unspecified nonsuppurative otitis media, right ear: Secondary | ICD-10-CM | POA: Diagnosis not present

## 2015-06-25 DIAGNOSIS — H6122 Impacted cerumen, left ear: Secondary | ICD-10-CM | POA: Diagnosis not present

## 2015-06-25 MED ORDER — AMOXICILLIN 875 MG PO TABS: 875.0000 mg | ORAL_TABLET | Freq: Two times a day (BID) | ORAL | Status: DC

## 2015-06-25 NOTE — Progress Notes (Signed)
Pre visit review using our clinic review tool, if applicable. No additional management support is needed unless otherwise documented below in the visit note. 

## 2015-06-25 NOTE — Progress Notes (Signed)
   Subjective:    Patient ID: Dawn Franco, female    DOB: 1950-09-26, 65 y.o.   MRN: [id number]  HPI   65 year old female who presents to the office today with bilateral ear pain x 4 days. She also reports that she has had right sided throat pain for the last 4 days as well. Took 2 amoxicillin she had for her dentist appointments last night, which she endorses helped with the pain.   Denies any fevers, sinus pain and pressure, or any productive cough.   Review of Systems  Constitutional: Negative.   HENT: Positive for ear pain, sore throat and trouble swallowing. Negative for ear discharge, facial swelling, hearing loss, postnasal drip, rhinorrhea, sinus pressure, tinnitus and voice change.   Eyes: Negative.   Respiratory: Negative.   Cardiovascular: Negative.   Neurological: Negative.        Objective:   Physical Exam  Constitutional: She is oriented to person, place, and time. She appears well-developed and well-nourished. No distress.  HENT:  Head: Normocephalic and atraumatic.  Right Ear: External ear normal.  Left Ear: External ear normal.  Nose: Nose normal.  Mouth/Throat: Oropharynx is clear and moist. No oropharyngeal exudate.  Cerumen impaction in left ear. Otitis media with effusion in right ear.   Neck: Normal range of motion. Neck supple.  Cardiovascular: Normal rate, regular rhythm, normal heart sounds and intact distal pulses.  Exam reveals no gallop and no friction rub.   No murmur heard. Pulmonary/Chest: Effort normal and breath sounds normal. No respiratory distress. She has no wheezes. She has no rales. She exhibits no tenderness.  Musculoskeletal: Normal range of motion. She exhibits no edema or tenderness.  Lymphadenopathy:       Head (right side): Tonsillar and preauricular adenopathy present.       Head (left side): Tonsillar and preauricular adenopathy present.  Neurological: She is alert and oriented to person, place, and time. She has normal  reflexes. No cranial nerve deficit. She exhibits normal muscle tone. Coordination normal.  Skin: Skin is warm and dry. No rash noted. She is not diaphoretic. No erythema. No pallor.  Psychiatric: She has a normal mood and affect. Her behavior is normal. Thought content normal.  Nursing note and vitals reviewed.      Assessment & Plan:  1. Otitis media with effusion, right - amoxicillin (AMOXIL) 875 MG tablet; Take 1 tablet (875 mg total) by mouth 2 (two) times daily.  Dispense: 14 tablet; Refill: 0 - Follow up if no improvement in the next 2-3 days.   2. Cerumen impaction, left - Cerumen impaction was able to be easily removed with lavage. She does not appear to have any signs of infection in her left ear.

## 2015-06-25 NOTE — Patient Instructions (Addendum)
It was great seeing you again  I have sent in a prescription for Amoxicillin. Take these twice a day for 14 days.   Follow up if no improvement in the next 2-3 days.   Otitis Media With Effusion Otitis media with effusion is the presence of fluid in the middle ear. This is a common problem in children, which often follows ear infections. It may be present for weeks or longer after the infection. Unlike an acute ear infection, otitis media with effusion refers only to fluid behind the ear drum and not infection. Children with repeated ear and sinus infections and allergy problems are the most likely to get otitis media with effusion. CAUSES  The most frequent cause of the fluid buildup is dysfunction of the eustachian tubes. These are the tubes that drain fluid in the ears to the back of the nose (nasopharynx). SYMPTOMS   The main symptom of this condition is hearing loss. As a result, you or your child may:  Listen to the TV at a loud volume.  Not respond to questions.  Ask "what" often when spoken to.  Mistake or confuse one sound or word for another.  There may be a sensation of fullness or pressure but usually not pain. DIAGNOSIS   Your health care provider will diagnose this condition by examining you or your child's ears.  Your health care provider may test the pressure in you or your child's ear with a tympanometer.  A hearing test may be conducted if the problem persists. TREATMENT   Treatment depends on the duration and the effects of the effusion.  Antibiotics, decongestants, nose drops, and cortisone-type drugs (tablets or nasal spray) may not be helpful.  Children with persistent ear effusions may have delayed language or behavioral problems. Children at risk for developmental delays in hearing, learning, and speech may require referral to a specialist earlier than children not at risk.  You or your child's health care provider may suggest a referral to an ear, nose,  and throat surgeon for treatment. The following may help restore normal hearing:  Drainage of fluid.  Placement of ear tubes (tympanostomy tubes).  Removal of adenoids (adenoidectomy). HOME CARE INSTRUCTIONS   Avoid secondhand smoke.  Infants who are breastfed are less likely to have this condition.  Avoid feeding infants while they are lying flat.  Avoid known environmental allergens.  Avoid people who are sick. SEEK MEDICAL CARE IF:   Hearing is not better in 3 months.  Hearing is worse.  Ear pain.  Drainage from the ear.  Dizziness. MAKE SURE YOU:   Understand these instructions.  Will watch your condition.  Will get help right away if you are not doing well or get worse.   This information is not intended to replace advice given to you by your health care provider. Make sure you discuss any questions you have with your health care provider.   Document Released: 06/05/2004 Document Revised: 05/19/2014 Document Reviewed: 11/23/2012 Elsevier Interactive Patient Education [employer].

## 2015-08-16 DIAGNOSIS — D2230 Melanocytic nevi of unspecified part of face: Secondary | ICD-10-CM | POA: Diagnosis not present

## 2015-08-16 DIAGNOSIS — L814 Other melanin hyperpigmentation: Secondary | ICD-10-CM | POA: Diagnosis not present

## 2015-08-16 DIAGNOSIS — Z85828 Personal history of other malignant neoplasm of skin: Secondary | ICD-10-CM | POA: Diagnosis not present

## 2015-08-16 DIAGNOSIS — D225 Melanocytic nevi of trunk: Secondary | ICD-10-CM | POA: Diagnosis not present

## 2015-08-16 DIAGNOSIS — Z808 Family history of malignant neoplasm of other organs or systems: Secondary | ICD-10-CM | POA: Diagnosis not present

## 2015-08-16 DIAGNOSIS — D1800 Hemangioma unspecified site: Secondary | ICD-10-CM | POA: Diagnosis not present

## 2015-08-16 DIAGNOSIS — L821 Other seborrheic keratosis: Secondary | ICD-10-CM | POA: Diagnosis not present

## 2015-08-16 DIAGNOSIS — Z85820 Personal history of malignant melanoma of skin: Secondary | ICD-10-CM | POA: Diagnosis not present

## 2016-02-05 ENCOUNTER — Telehealth: Payer: Self-pay | Admitting: Adult Health

## 2016-02-05 NOTE — Telephone Encounter (Signed)
Pt would like to trans care to Dr Tabori, Please advise ok to schedule. °

## 2016-02-05 NOTE — Telephone Encounter (Signed)
She can establish here but it may be with our incoming PA rather than me based on new pt wait times

## 2016-03-10 NOTE — Telephone Encounter (Signed)
LM for pt to call back.

## 2016-04-01 DIAGNOSIS — Z1231 Encounter for screening mammogram for malignant neoplasm of breast: Secondary | ICD-10-CM | POA: Diagnosis not present

## 2016-04-01 LAB — HM MAMMOGRAPHY

## 2016-04-08 ENCOUNTER — Encounter: Payer: Self-pay | Admitting: General Practice

## 2016-04-15 DIAGNOSIS — Z124 Encounter for screening for malignant neoplasm of cervix: Secondary | ICD-10-CM | POA: Diagnosis not present

## 2016-06-19 ENCOUNTER — Encounter: Payer: Self-pay | Admitting: Family Medicine

## 2016-06-19 ENCOUNTER — Ambulatory Visit (INDEPENDENT_AMBULATORY_CARE_PROVIDER_SITE_OTHER): Payer: PPO | Admitting: Family Medicine

## 2016-06-19 VITALS — BP 126/86 | HR 74 | Temp 98.5°F | Resp 16 | Ht 65.0 in | Wt 151.2 lb

## 2016-06-19 DIAGNOSIS — Z0000 Encounter for general adult medical examination without abnormal findings: Secondary | ICD-10-CM | POA: Diagnosis not present

## 2016-06-19 DIAGNOSIS — E663 Overweight: Secondary | ICD-10-CM | POA: Diagnosis not present

## 2016-06-19 DIAGNOSIS — Z0100 Encounter for examination of eyes and vision without abnormal findings: Secondary | ICD-10-CM | POA: Diagnosis not present

## 2016-06-19 DIAGNOSIS — Z1159 Encounter for screening for other viral diseases: Secondary | ICD-10-CM

## 2016-06-19 DIAGNOSIS — Z23 Encounter for immunization: Secondary | ICD-10-CM | POA: Diagnosis not present

## 2016-06-19 DIAGNOSIS — E559 Vitamin D deficiency, unspecified: Secondary | ICD-10-CM | POA: Diagnosis not present

## 2016-06-19 LAB — HEPATIC FUNCTION PANEL
ALK PHOS: 80 U/L (ref 39–117)
ALT: 16 U/L (ref 0–35)
AST: 17 U/L (ref 0–37)
Albumin: 4.8 g/dL (ref 3.5–5.2)
BILIRUBIN DIRECT: 0.1 mg/dL (ref 0.0–0.3)
BILIRUBIN TOTAL: 0.4 mg/dL (ref 0.2–1.2)
Total Protein: 7.6 g/dL (ref 6.0–8.3)

## 2016-06-19 LAB — CBC WITH DIFFERENTIAL/PLATELET
BASOS PCT: 1.2 % (ref 0.0–3.0)
Basophils Absolute: 0.1 10*3/uL (ref 0.0–0.1)
EOS PCT: 1.3 % (ref 0.0–5.0)
Eosinophils Absolute: 0.1 10*3/uL (ref 0.0–0.7)
HEMATOCRIT: 42.4 % (ref 36.0–46.0)
Hemoglobin: 14.5 g/dL (ref 12.0–15.0)
LYMPHS ABS: 2.2 10*3/uL (ref 0.7–4.0)
LYMPHS PCT: 31.6 % (ref 12.0–46.0)
MCHC: 34.2 g/dL (ref 30.0–36.0)
MCV: 94.5 fl (ref 78.0–100.0)
MONOS PCT: 7.9 % (ref 3.0–12.0)
Monocytes Absolute: 0.5 10*3/uL (ref 0.1–1.0)
NEUTROS ABS: 4 10*3/uL (ref 1.4–7.7)
Neutrophils Relative %: 58 % (ref 43.0–77.0)
PLATELETS: 380 10*3/uL (ref 150.0–400.0)
RBC: 4.48 Mil/uL (ref 3.87–5.11)
RDW: 13.6 % (ref 11.5–15.5)
WBC: 7 10*3/uL (ref 4.0–10.5)

## 2016-06-19 LAB — LIPID PANEL
CHOLESTEROL: 243 mg/dL — AB (ref 0–200)
HDL: 91.6 mg/dL (ref >39.00)
LDL CALC: 127 mg/dL — AB (ref 0–99)
NONHDL: 151.65
Total CHOL/HDL Ratio: 3
Triglycerides: 125 mg/dL (ref 0.0–149.0)
VLDL: 25 mg/dL (ref 0.0–40.0)

## 2016-06-19 LAB — VITAMIN D 25 HYDROXY (VIT D DEFICIENCY, FRACTURES): VITD: 36.96 ng/mL (ref 30.00–100.00)

## 2016-06-19 LAB — BASIC METABOLIC PANEL
BUN: 23 mg/dL (ref 6–23)
CALCIUM: 10.1 mg/dL (ref 8.4–10.5)
CO2: 32 mEq/L (ref 19–32)
Chloride: 101 mEq/L (ref 96–112)
Creatinine, Ser: 0.8 mg/dL (ref 0.40–1.20)
GFR: 76.28 mL/min (ref >60.00)
Glucose, Bld: 97 mg/dL (ref 70–99)
POTASSIUM: 5.1 meq/L (ref 3.5–5.1)
SODIUM: 140 meq/L (ref 135–145)

## 2016-06-19 LAB — TSH: TSH: 1.09 u[IU]/mL (ref 0.35–4.50)

## 2016-06-19 NOTE — Progress Notes (Signed)
   Subjective:    Patient ID: Dawn Franco, female    DOB: 1950/05/29, 66 y.o.   MRN: [id number]  HPI  New to establish.  Previous MD- Greta Doom  GYN- Mody  Here today for CPE.  Risk Factors: Overweight- pt's BMI is 25.17 Physical Activity: exercising regularly Fall Risk: low Depression: denies sxs Hearing: normal to conversational tones and whispered voice ADL's: independent Cognitive: normal linear thought process, memory and attention intact Home Safety: safe at home, lives w/ husband Height, Weight, BMI, Visual Acuity: see vitals, vision corrected to 20/20 w/ glasses Counseling: due for colonoscopy- declines, prefers cologuard.  UTD on mammo.  Due for Prevnar.  Last DEXA 2015- she was told to repeat next year Care team reviewed and updated POA/Living will: pt has both- will bring copies Labs Ordered: See A&P Care Plan: See A&P    Review of Systems Patient reports no vision/ hearing changes, adenopathy,fever, weight change,  persistant/recurrent hoarseness , swallowing issues, chest pain, palpitations, edema, persistant/recurrent cough, hemoptysis, dyspnea (rest/exertional/paroxysmal nocturnal), gastrointestinal bleeding (melena, rectal bleeding), abdominal pain, significant heartburn, bowel changes, GU symptoms (dysuria, hematuria, incontinence), Gyn symptoms (abnormal  bleeding, pain),  syncope, focal weakness, memory loss, numbness & tingling, skin/hair/nail changes, abnormal bruising or bleeding, anxiety, or depression.     Objective:   Physical Exam General Appearance:    Alert, cooperative, no distress, appears stated age  Head:    Normocephalic, without obvious abnormality, atraumatic  Eyes:    PERRL, conjunctiva/corneas clear, EOM's intact, fundi    benign, both eyes  Ears:    Normal TM's and external ear canals, both ears  Nose:   Nares normal, septum midline, mucosa normal, no drainage    or sinus tenderness  Throat:   Lips, mucosa, and tongue normal; teeth and  gums normal  Neck:   Supple, symmetrical, trachea midline, no adenopathy;    Thyroid: no enlargement/tenderness/nodules  Back:     Symmetric, no curvature, ROM normal, no CVA tenderness  Lungs:     Clear to auscultation bilaterally, respirations unlabored  Chest Wall:    No tenderness or deformity   Heart:    Regular rate and rhythm, S1 and S2 normal, + II-III/VI SEM,   no rub or gallop  Breast Exam:    Deferred to GYN  Abdomen:     Soft, non-tender, bowel sounds active all four quadrants,    no masses, no organomegaly  Genitalia:    Deferred to GYN  Rectal:    Extremities:   Extremities normal, atraumatic, no cyanosis or edema  Pulses:   2+ and symmetric all extremities  Skin:   Skin color, texture, turgor normal, no rashes or lesions  Lymph nodes:   Cervical, supraclavicular, and axillary nodes normal  Neurologic:   CNII-XII intact, normal strength, sensation and reflexes    throughout          Assessment & Plan:

## 2016-06-19 NOTE — Assessment & Plan Note (Signed)
Pt has hx of similar.  Check labs.  Replete prn.  Pt to resume daily Ca and Vit D supplement

## 2016-06-19 NOTE — Progress Notes (Signed)
Pre visit review using our clinic review tool, if applicable. No additional management support is needed unless otherwise documented below in the visit note. 

## 2016-06-19 NOTE — Assessment & Plan Note (Signed)
Pt's PE WNL w/ exception of known heart murmur.  UTD on GYN.  Pt to repeat DEXA next year.  Declines repeat colonoscopy- pt prefers Cologuard.  Prevnar given today.  Written screening schedule updated and given to pt.  Check labs.  Anticipatory guidance provided.

## 2016-06-19 NOTE — Patient Instructions (Signed)
Follow up in 1 year or as needed We'll notify you of your lab results and make any changes if needed Keep up the good work on healthy diet and regular exercise- you look great!!! Complete the cologuard at your convenience We'll call you with your eye exam You are up to date on mammo- great job! Call with any questions or concerns Welcome!  We're glad to have you!!!

## 2016-06-20 LAB — HEPATITIS C ANTIBODY: HCV Ab: NEGATIVE

## 2016-07-01 DIAGNOSIS — Z1212 Encounter for screening for malignant neoplasm of rectum: Secondary | ICD-10-CM | POA: Diagnosis not present

## 2016-07-01 DIAGNOSIS — Z1211 Encounter for screening for malignant neoplasm of colon: Secondary | ICD-10-CM | POA: Diagnosis not present

## 2016-07-01 LAB — COLOGUARD: Cologuard: NEGATIVE

## 2016-07-08 ENCOUNTER — Encounter: Payer: Self-pay | Admitting: General Practice

## 2016-08-13 ENCOUNTER — Telehealth: Payer: Self-pay | Admitting: Family Medicine

## 2016-08-13 ENCOUNTER — Other Ambulatory Visit: Payer: Self-pay | Admitting: Physician Assistant

## 2016-08-13 NOTE — Telephone Encounter (Signed)
Called and spoke with pt, she is ok with waiting until PCP returns to office.

## 2016-08-13 NOTE — Telephone Encounter (Signed)
Will defer to PCP on this medication giving potential side effects in a patient her age.

## 2016-08-13 NOTE — Telephone Encounter (Signed)
Pt asking if a Rx for motion sickness patches could be called in to pharmacy for her, she will be going on a cruise at the end of month. Pt also [facility] [address].

## 2016-08-13 NOTE — Telephone Encounter (Signed)
Please advise, pt had her CPE 06/19/16

## 2016-08-17 NOTE — Telephone Encounter (Signed)
Ok for scopolamine patches 0.4mg  to be changed every 3 days, box of 4, no refills

## 2016-08-18 DIAGNOSIS — D1800 Hemangioma unspecified site: Secondary | ICD-10-CM | POA: Diagnosis not present

## 2016-08-18 DIAGNOSIS — L814 Other melanin hyperpigmentation: Secondary | ICD-10-CM | POA: Diagnosis not present

## 2016-08-18 DIAGNOSIS — Z85820 Personal history of malignant melanoma of skin: Secondary | ICD-10-CM | POA: Diagnosis not present

## 2016-08-18 DIAGNOSIS — D225 Melanocytic nevi of trunk: Secondary | ICD-10-CM | POA: Diagnosis not present

## 2016-08-18 DIAGNOSIS — L821 Other seborrheic keratosis: Secondary | ICD-10-CM | POA: Diagnosis not present

## 2016-08-18 DIAGNOSIS — Z85828 Personal history of other malignant neoplasm of skin: Secondary | ICD-10-CM | POA: Diagnosis not present

## 2016-08-18 MED ORDER — SCOPOLAMINE 1 MG/3DAYS TD PT72
1.0000 | MEDICATED_PATCH | TRANSDERMAL | 0 refills | Status: DC
Start: 2016-08-18 — End: 2017-06-23

## 2016-08-18 NOTE — Addendum Note (Signed)
Addended by: Davis Gourd on: 08/18/2016 08:48 AM   Modules accepted: Orders

## 2016-08-18 NOTE — Telephone Encounter (Signed)
Medication was filled for pt today.

## 2016-08-28 DIAGNOSIS — H2513 Age-related nuclear cataract, bilateral: Secondary | ICD-10-CM | POA: Diagnosis not present

## 2016-08-28 DIAGNOSIS — H5203 Hypermetropia, bilateral: Secondary | ICD-10-CM | POA: Diagnosis not present

## 2016-08-28 DIAGNOSIS — H524 Presbyopia: Secondary | ICD-10-CM | POA: Diagnosis not present

## 2016-11-15 DIAGNOSIS — N390 Urinary tract infection, site not specified: Secondary | ICD-10-CM | POA: Diagnosis not present

## 2017-04-14 DIAGNOSIS — Z1231 Encounter for screening mammogram for malignant neoplasm of breast: Secondary | ICD-10-CM | POA: Diagnosis not present

## 2017-04-14 LAB — HM MAMMOGRAPHY

## 2017-04-17 ENCOUNTER — Other Ambulatory Visit: Payer: Self-pay | Admitting: General Practice

## 2017-06-04 DIAGNOSIS — N390 Urinary tract infection, site not specified: Secondary | ICD-10-CM | POA: Diagnosis not present

## 2017-06-22 NOTE — Progress Notes (Addendum)
Subjective:   Dawn Franco is a 67 y.o. female who presents for Medicare Annual (Subsequent) preventive examination.  Review of Systems:  No ROS.  Medicare Wellness Visit. Additional risk factors are reflected in the social history.  Cardiac Risk Factors include: advanced age (>34men, >51 women)   Sleep patterns: Sleeps 7-8 hours.  Home Safety/Smoke Alarms: Feels safe in home. Smoke alarms in place.  Living environment; residence and Firearm Safety: Lives with husband in 2 story home.  Seat Belt Safety/Bike Helmet: Wears seat belt.   Female:   [id number]       Mammo-04/14/2017, negative.        Dexa scan-06/19/2013, Osteopenia. Ordered today, [facility].         CCS-Cologuard 07/01/16, negative.      Objective:     Vitals: BP (!) 158/90 (BP Location: Left Arm, Patient Position: Sitting, Cuff Size: Normal)   Pulse 62   Temp 97.9 F (36.6 C) (Temporal)   Resp 18   Ht 5\' 5"  (1.651 m)   Wt 152 lb 6.4 oz (69.1 kg)   LMP 05/12/2001   SpO2 98%   BMI 25.36 kg/m   Body mass index is 25.36 kg/m.  Advanced Directives 06/23/2017  Does Patient Have a Medical Advance Directive? Yes  Type of Advance Directive Living will;Healthcare Power of [place] in Chart? No - copy requested    Tobacco Social History   Tobacco Use  Smoking Status Never Smoker  Smokeless Tobacco Never Used     Counseling given: Not Answered    Past Medical History:  Diagnosis Date  . Basal cell carcinoma    forehead  . Chicken pox   . Heart murmur    Mitral Valve  . 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 local kin cancer removal    . MOHS SURGERY    . TONSILLECTOMY AND ADENOIDECTOMY  1957   Family History  Problem Relation Age of Onset  . Hypertension Mother    Social History   Socioeconomic History  . Marital status: Married    Spouse name: None  . Number of children: None  . Years of education:  None  . Highest education level: None  Social Needs  . Financial resource strain: None  . Food insecurity - worry: None  . Food insecurity - inability: None  . Transportation needs - medical: None  . Transportation needs - non-medical: None  Occupational History  . None  Tobacco Use  . Smoking status: Never Smoker  . Smokeless tobacco: Never Used  Substance and Sexual Activity  . Alcohol use: Yes    Alcohol/week: 0.0 oz    Comment: Wine with meals  . Drug use: No  . Sexual activity: None  Other Topics Concern  . None  Social History Narrative   She does not work    Married 37 years   Two children both are local ( Son 37, Daughter 33)    Outpatient Encounter Medications as of 06/23/2017  Medication Sig  . FLUZONE HIGH-DOSE 0.5 ML SUSY TO BE ADMINISTERED BY PHARMACIST FOR IMMUNIZATION  . scopolamine (TRANSDERM-SCOP) 1 MG/3DAYS Place 1 patch (1.5 mg total) onto the skin every 3 (three) days. (Patient not taking: Reported on 06/23/2017)  . Zoster Vaccine Adjuvanted [facility]) injection Inject 0.5 mLs into the muscle once for 1 dose.   No facility-administered encounter medications on file as of 06/23/2017.  Activities of Daily Living In your present state of health, do you have any difficulty performing the following activities: 06/23/2017 06/23/2017  Hearing? N N  Vision? N N  Difficulty concentrating or making decisions? N N  Walking or climbing stairs? N N  Dressing or bathing? N N  Doing errands, shopping? N N  Preparing Food and eating ? - N  Using the Toilet? - N  In the past six months, have you accidently leaked urine? - N  Do you have problems with loss of bowel control? - N  Managing your Medications? - N  Managing your Finances? - N  Housekeeping or managing your Housekeeping? - N  Some recent data might be hidden    Patient Care Team: Midge Minium, MD as PCP - General (Family Medicine) Katy Apo, MD as Consulting Physician  (Ophthalmology) Renda Rolls, Jennefer Bravo, MD as Referring Physician (Dermatology) Azucena Fallen, MD as Consulting Physician (Obstetrics and Gynecology)    Assessment:   This is a routine wellness examination for Dawn Franco.  Exercise Activities and Dietary recommendations Current Exercise Habits: Structured exercise class, Type of exercise: stretching;strength training/weights(Aerobic/strength/stretching class), Time (Minutes): > 60, Frequency (Times/Week): 3, Weekly Exercise (Minutes/Week): 0, Exercise limited by: None identified   Diet (meal preparation, eat out, water intake, caffeinated beverages, dairy products, fruits and vegetables): Drinks water, coffee, wine and hot tea.   Eats heart healthy diet.   Goals    . Patient Stated     Maintain current health by staying active.        Fall Risk Fall Risk  06/23/2017 06/23/2017 06/23/2017 06/19/2016  Falls in the past year? No No No No   Depression Screen PHQ 2/9 Scores 06/23/2017 06/23/2017 06/19/2016  PHQ - 2 Score 0 0 0  PHQ- 9 Score 0 - 0     Cognitive Function       Ad8 score reviewed for issues:  Issues making decisions: no  Less interest in hobbies / activities: no  Repeats questions, stories (family complaining): no  Trouble using ordinary gadgets (microwave, computer, phone): no  Forgets the month or year: no  Mismanaging finances: no  Remembering appts: no  Daily problems with thinking and/or memory: no Ad8 score is=0     Immunization History  Administered Date(s) Administered  . Influenza, High Dose Seasonal PF 06/07/2016  . Influenza,inj,Quad PF,6+ Mos 03/12/2017  . Pneumococcal Conjugate-13 06/19/2016  . Pneumococcal Polysaccharide-23 06/23/2017  . Tdap 05/12/2006    Screening Tests Health Maintenance  Topic Date Due  . TETANUS/TDAP  06/23/2018 (Originally 05/12/2016)  . MAMMOGRAM  04/15/2019  . Fecal DNA (Cologuard)  07/02/2019  . INFLUENZA VACCINE  Completed  . DEXA SCAN  Completed  . Hepatitis  C Screening  Completed  . PNA vac Low Risk Adult  Completed        Plan:    Schedule bone scan.   Shingles vaccine at pharmacy.  Bring a copy of your living will and/or healthcare power of attorney to your next office visit after updating.   Continue doing brain stimulating activities (puzzles, reading, adult coloring books, staying active) to keep memory sharp.   I have personally reviewed and noted the following in the patient's chart:   . Medical and social history . Use of alcohol, tobacco or illicit drugs  . Current medications and supplements . Functional ability and status . Nutritional status . Physical activity . Advanced directives . List of other physicians . Hospitalizations, surgeries, and ER visits in previous 43  months . Vitals . Screenings to include cognitive, depression, and falls . Referrals and appointments  In addition, I have reviewed and discussed with patient certain preventive protocols, quality metrics, and best practice recommendations. A written personalized care plan for preventive services as well as general preventive health recommendations were provided to patient.     Gerilyn Nestle, RN  06/23/2017  Reviewed documentation provided by RN and agree w/ above.  Annye Asa, MD

## 2017-06-23 ENCOUNTER — Encounter: Payer: Self-pay | Admitting: Family Medicine

## 2017-06-23 ENCOUNTER — Ambulatory Visit (INDEPENDENT_AMBULATORY_CARE_PROVIDER_SITE_OTHER): Payer: PPO | Admitting: Family Medicine

## 2017-06-23 ENCOUNTER — Other Ambulatory Visit: Payer: Self-pay

## 2017-06-23 ENCOUNTER — Ambulatory Visit (INDEPENDENT_AMBULATORY_CARE_PROVIDER_SITE_OTHER): Payer: PPO

## 2017-06-23 VITALS — BP 128/85 | HR 62 | Temp 97.9°F | Resp 18 | Ht 65.0 in | Wt 152.2 lb

## 2017-06-23 VITALS — BP 158/90 | HR 62 | Temp 97.9°F | Resp 18 | Ht 65.0 in | Wt 152.4 lb

## 2017-06-23 DIAGNOSIS — Z0000 Encounter for general adult medical examination without abnormal findings: Secondary | ICD-10-CM

## 2017-06-23 DIAGNOSIS — E2839 Other primary ovarian failure: Secondary | ICD-10-CM | POA: Diagnosis not present

## 2017-06-23 DIAGNOSIS — E785 Hyperlipidemia, unspecified: Secondary | ICD-10-CM

## 2017-06-23 DIAGNOSIS — E559 Vitamin D deficiency, unspecified: Secondary | ICD-10-CM

## 2017-06-23 DIAGNOSIS — Z23 Encounter for immunization: Secondary | ICD-10-CM | POA: Diagnosis not present

## 2017-06-23 DIAGNOSIS — Z01419 Encounter for gynecological examination (general) (routine) without abnormal findings: Secondary | ICD-10-CM | POA: Diagnosis not present

## 2017-06-23 LAB — CBC WITH DIFFERENTIAL/PLATELET
BASOS PCT: 1 % (ref 0.0–3.0)
Basophils Absolute: 0 10*3/uL (ref 0.0–0.1)
EOS ABS: 0.1 10*3/uL (ref 0.0–0.7)
EOS PCT: 1.8 % (ref 0.0–5.0)
HCT: 42.9 % (ref 36.0–46.0)
Hemoglobin: 14.7 g/dL (ref 12.0–15.0)
LYMPHS ABS: 1.5 10*3/uL (ref 0.7–4.0)
Lymphocytes Relative: 30.3 % (ref 12.0–46.0)
MCHC: 34.3 g/dL (ref 30.0–36.0)
MCV: 94.1 fl (ref 78.0–100.0)
MONO ABS: 0.4 10*3/uL (ref 0.1–1.0)
Monocytes Relative: 9 % (ref 3.0–12.0)
NEUTROS ABS: 2.8 10*3/uL (ref 1.4–7.7)
NEUTROS PCT: 57.9 % (ref 43.0–77.0)
PLATELETS: 367 10*3/uL (ref 150.0–400.0)
RBC: 4.56 Mil/uL (ref 3.87–5.11)
RDW: 13.4 % (ref 11.5–15.5)
WBC: 4.9 10*3/uL (ref 4.0–10.5)

## 2017-06-23 LAB — HEPATIC FUNCTION PANEL
ALT: 13 U/L (ref 0–35)
AST: 15 U/L (ref 0–37)
Albumin: 4.6 g/dL (ref 3.5–5.2)
Alkaline Phosphatase: 70 U/L (ref 39–117)
BILIRUBIN DIRECT: 0.1 mg/dL (ref 0.0–0.3)
BILIRUBIN TOTAL: 0.6 mg/dL (ref 0.2–1.2)
TOTAL PROTEIN: 7.3 g/dL (ref 6.0–8.3)

## 2017-06-23 LAB — LIPID PANEL
CHOLESTEROL: 238 mg/dL — AB (ref 0–200)
HDL: 80.2 mg/dL (ref >39.00)
LDL Cholesterol: 141 mg/dL — ABNORMAL HIGH (ref 0–99)
NONHDL: 158.05
Total CHOL/HDL Ratio: 3
Triglycerides: 86 mg/dL (ref 0.0–149.0)
VLDL: 17.2 mg/dL (ref 0.0–40.0)

## 2017-06-23 LAB — TSH: TSH: 1.09 u[IU]/mL (ref 0.35–4.50)

## 2017-06-23 LAB — VITAMIN D 25 HYDROXY (VIT D DEFICIENCY, FRACTURES): VITD: 44.78 ng/mL (ref 30.00–100.00)

## 2017-06-23 LAB — BASIC METABOLIC PANEL
BUN: 22 mg/dL (ref 6–23)
CHLORIDE: 101 meq/L (ref 96–112)
CO2: 30 mEq/L (ref 19–32)
Calcium: 9.7 mg/dL (ref 8.4–10.5)
Creatinine, Ser: 0.77 mg/dL (ref 0.40–1.20)
GFR: 79.48 mL/min (ref >60.00)
GLUCOSE: 84 mg/dL (ref 70–99)
Potassium: 4.4 mEq/L (ref 3.5–5.1)
SODIUM: 137 meq/L (ref 135–145)

## 2017-06-23 MED ORDER — ZOSTER VAC RECOMB ADJUVANTED 50 MCG/0.5ML IM SUSR: 0.5000 mL | Freq: Once | INTRAMUSCULAR | 1 refills | Status: AC

## 2017-06-23 MED ORDER — SCOPOLAMINE 1 MG/3DAYS TD PT72: 1.0000 | MEDICATED_PATCH | TRANSDERMAL | 0 refills | Status: DC

## 2017-06-23 NOTE — Progress Notes (Signed)
   Subjective:    Patient ID: Dawn Franco, female    DOB: Feb 25, 1951, 67 y.o.   MRN: [id number]  HPI CPE- UTD on cologuard, mammo.  DEXA ordered today.  Pneumovax given.  Shingrix ordered.  BP was elevated this AM but she feels it is related to her caffeine intake this AM.  BP 2 weeks ago for UTI visit was WNL.  No concerns today.   Review of Systems Patient reports no vision/ hearing changes, adenopathy,fever, weight change,  persistant/recurrent hoarseness , swallowing issues, chest pain, palpitations, edema, persistant/recurrent cough, hemoptysis, dyspnea (rest/exertional/paroxysmal nocturnal), gastrointestinal bleeding (melena, rectal bleeding), abdominal pain, significant heartburn, bowel changes, GU symptoms (dysuria, hematuria, incontinence), Gyn symptoms (abnormal  bleeding, pain),  syncope, focal weakness, memory loss, numbness & tingling, skin/hair/nail changes, abnormal bruising or bleeding, anxiety, or depression.     Objective:   Physical Exam General Appearance:    Alert, cooperative, no distress, appears stated age  Head:    Normocephalic, without obvious abnormality, atraumatic  Eyes:    PERRL, conjunctiva/corneas clear, EOM's intact, fundi    benign, both eyes  Ears:    Normal TM's and external ear canals, both ears  Nose:   Nares normal, septum midline, mucosa normal, no drainage    or sinus tenderness  Throat:   Lips, mucosa, and tongue normal; teeth and gums normal  Neck:   Supple, symmetrical, trachea midline, no adenopathy;    Thyroid: no enlargement/tenderness/nodules  Back:     Symmetric, no curvature, ROM normal, no CVA tenderness  Lungs:     Clear to auscultation bilaterally, respirations unlabored  Chest Wall:    No tenderness or deformity   Heart:    Regular rate and rhythm, S1 and S2 normal, no murmur, rub   or gallop  Breast Exam:    Deferred to mammo  Abdomen:     Soft, non-tender, bowel sounds active all four quadrants,    no masses, no  organomegaly  Genitalia:    Deferred  Rectal:    Extremities:   Extremities normal, atraumatic, no cyanosis or edema  Pulses:   2+ and symmetric all extremities  Skin:   Skin color, texture, turgor normal, no rashes or lesions  Lymph nodes:   Cervical, supraclavicular, and axillary nodes normal  Neurologic:   CNII-XII intact, normal strength, sensation and reflexes    throughout          Assessment & Plan:

## 2017-06-23 NOTE — Patient Instructions (Addendum)
Schedule bone scan.   Shingles vaccine at pharmacy.  Bring a copy of your living will and/or healthcare power of attorney to your next office visit after updating.   Continue doing brain stimulating activities (puzzles, reading, adult coloring books, staying active) to keep memory sharp.   Health Maintenance, Female Adopting a healthy lifestyle and getting preventive care can go a long way to promote health and wellness. Talk with your health care provider about what schedule of regular examinations is right for you. This is a good chance for you to check in with your provider about disease prevention and staying healthy. In between checkups, there are plenty of things you can do on your own. Experts have done a lot of research about which lifestyle changes and preventive measures are most likely to keep you healthy. Ask your health care provider for more information. Weight and diet Eat a healthy diet  Be sure to include plenty of vegetables, fruits, low-fat dairy products, and lean protein.  Do not eat a lot of foods high in solid fats, added sugars, or salt.  Get regular exercise. This is one of the most important things you can do for your health. ? Most adults should exercise for at least 150 minutes each week. The exercise should increase your heart rate and make you sweat (moderate-intensity exercise). ? Most adults should also do strengthening exercises at least twice a week. This is in addition to the moderate-intensity exercise.  Maintain a healthy weight  Body mass index (BMI) is a measurement that can be used to identify possible weight problems. It estimates body fat based on height and weight. Your health care provider can help determine your BMI and help you achieve or maintain a healthy weight.  For females 21 years of age and older: ? A BMI below 18.5 is considered underweight. ? A BMI of 18.5 to 24.9 is normal. ? A BMI of 25 to 29.9 is considered overweight. ? A BMI of  30 and above is considered obese.  Watch levels of cholesterol and blood lipids  You should start having your blood tested for lipids and cholesterol at 67 years of age, then have this test every 5 years.  You may need to have your cholesterol levels checked more often if: ? Your lipid or cholesterol levels are high. ? You are older than 67 years of age. ? You are at high risk for heart disease.  Cancer screening Lung Cancer  Lung cancer screening is recommended for adults 7-74 years old who are at high risk for lung cancer because of a history of smoking.  A yearly low-dose CT scan of the lungs is recommended for people who: ? Currently smoke. ? Have quit within the past 15 years. ? Have at least a 30-pack-year history of smoking. A pack year is smoking an average of one pack of cigarettes a day for 1 year.  Yearly screening should continue until it has been 15 years since you quit.  Yearly screening should stop if you develop a health problem that would prevent you from having lung cancer treatment.  Breast Cancer  Practice breast self-awareness. This means understanding how your breasts normally appear and feel.  It also means doing regular breast self-exams. Let your health care provider know about any changes, no matter how small.  If you are in your 20s or 30s, you should have a clinical breast exam (CBE) by a health care provider every 1-3 years as part of a regular  health exam.  If you are 40 or older, have a CBE every year. Also consider having a breast X-ray (mammogram) every year.  If you have a family history of breast cancer, talk to your health care provider about genetic screening.  If you are at high risk for breast cancer, talk to your health care provider about having an MRI and a mammogram every year.  Breast cancer gene (BRCA) assessment is recommended for women who have family members with BRCA-related cancers. BRCA-related cancers  include: ? Breast. ? Ovarian. ? Tubal. ? Peritoneal cancers.  Results of the assessment will determine the need for genetic counseling and BRCA1 and BRCA2 testing.  Cervical Cancer Your health care provider may recommend that you be screened regularly for cancer of the pelvic organs (ovaries, uterus, and vagina). This screening involves a pelvic examination, including checking for microscopic changes to the surface of your cervix (Pap test). You may be encouraged to have this screening done every 3 years, beginning at age 21.  For women ages 30-65, health care providers may recommend pelvic exams and Pap testing every 3 years, or they may recommend the Pap and pelvic exam, combined with testing for human papilloma virus (HPV), every 5 years. Some types of HPV increase your risk of cervical cancer. Testing for HPV may also be done on women of any age with unclear Pap test results.  Other health care providers may not recommend any screening for nonpregnant women who are considered low risk for pelvic cancer and who do not have symptoms. Ask your health care provider if a screening pelvic exam is right for you.  If you have had past treatment for cervical cancer or a condition that could lead to cancer, you need Pap tests and screening for cancer for at least 20 years after your treatment. If Pap tests have been discontinued, your risk factors (such as having a new sexual partner) need to be reassessed to determine if screening should resume. Some women have medical problems that increase the chance of getting cervical cancer. In these cases, your health care provider may recommend more frequent screening and Pap tests.  Colorectal Cancer  This type of cancer can be detected and often prevented.  Routine colorectal cancer screening usually begins at 67 years of age and continues through 67 years of age.  Your health care provider may recommend screening at an earlier age if you have risk factors  for colon cancer.  Your health care provider may also recommend using home test kits to check for hidden blood in the stool.  A small camera at the end of a tube can be used to examine your colon directly (sigmoidoscopy or colonoscopy). This is done to check for the earliest forms of colorectal cancer.  Routine screening usually begins at age 50.  Direct examination of the colon should be repeated every 5-10 years through 67 years of age. However, you may need to be screened more often if early forms of precancerous polyps or small growths are found.  Skin Cancer  Check your skin from head to toe regularly.  Tell your health care provider about any new moles or changes in moles, especially if there is a change in a mole's shape or color.  Also tell your health care provider if you have a mole that is larger than the size of a pencil eraser.  Always use sunscreen. Apply sunscreen liberally and repeatedly throughout the day.  Protect yourself by wearing long sleeves, pants, a   wide-brimmed hat, and sunglasses whenever you are outside.  Heart disease, diabetes, and high blood pressure  High blood pressure causes heart disease and increases the risk of stroke. High blood pressure is more likely to develop in: ? People who have blood pressure in the high end of the normal range (130-139/85-89 mm Hg). ? People who are overweight or obese. ? People who are African American.  If you are 54-30 years of age, have your blood pressure checked every 3-5 years. If you are 3 years of age or older, have your blood pressure checked every year. You should have your blood pressure measured twice-once when you are at a hospital or clinic, and once when you are not at a hospital or clinic. Record the average of the two measurements. To check your blood pressure when you are not at a hospital or clinic, you can use: ? An automated blood pressure machine at a pharmacy. ? A home blood pressure monitor.  If  you are between 55 years and 21 years old, ask your health care provider if you should take aspirin to prevent strokes.  Have regular diabetes screenings. This involves taking a blood sample to check your fasting blood sugar level. ? If you are at a normal weight and have a low risk for diabetes, have this test once every three years after 67 years of age. ? If you are overweight and have a high risk for diabetes, consider being tested at a younger age or more often. Preventing infection Hepatitis B  If you have a higher risk for hepatitis B, you should be screened for this virus. You are considered at high risk for hepatitis B if: ? You were born in a country where hepatitis B is common. Ask your health care provider which countries are considered high risk. ? Your parents were born in a high-risk country, and you have not been immunized against hepatitis B (hepatitis B vaccine). ? You have HIV or AIDS. ? You use needles to inject street drugs. ? You live with someone who has hepatitis B. ? You have had sex with someone who has hepatitis B. ? You get hemodialysis treatment. ? You take certain medicines for conditions, including cancer, organ transplantation, and autoimmune conditions.  Hepatitis C  Blood testing is recommended for: ? Everyone born from [birth year] through 1965. ? Anyone with known risk factors for hepatitis C.  Sexually transmitted infections (STIs)  You should be screened for sexually transmitted infections (STIs) including gonorrhea and chlamydia if: ? You are sexually active and are younger than 67 years of age. ? You are older than 67 years of age and your health care provider tells you that you are at risk for this type of infection. ? Your sexual activity has changed since you were last screened and you are at an increased risk for chlamydia or gonorrhea. Ask your health care provider if you are at risk.  If you do not have HIV, but are at risk, it may be recommended  that you take a prescription medicine daily to prevent HIV infection. This is called pre-exposure prophylaxis (PrEP). You are considered at risk if: ? You are sexually active and do not regularly use condoms or know the HIV status of your partner(s). ? You take drugs by injection. ? You are sexually active with a partner who has HIV.  Talk with your health care provider about whether you are at high risk of being infected with HIV. If you choose to  begin PrEP, you should first be tested for HIV. You should then be tested every 3 months for as long as you are taking PrEP. Pregnancy  If you are premenopausal and you may become pregnant, ask your health care provider about preconception counseling.  If you may become pregnant, take 400 to 800 micrograms (mcg) of folic acid every day.  If you want to prevent pregnancy, talk to your health care provider about birth control (contraception). Osteoporosis and menopause  Osteoporosis is a disease in which the bones lose minerals and strength with aging. This can result in serious bone fractures. Your risk for osteoporosis can be identified using a bone density scan.  If you are 64 years of age or older, or if you are at risk for osteoporosis and fractures, ask your health care provider if you should be screened.  Ask your health care provider whether you should take a calcium or vitamin D supplement to lower your risk for osteoporosis.  Menopause may have certain physical symptoms and risks.  Hormone replacement therapy may reduce some of these symptoms and risks. Talk to your health care provider about whether hormone replacement therapy is right for you. Follow these instructions at home:  Schedule regular health, dental, and eye exams.  Stay current with your immunizations.  Do not use any tobacco products including cigarettes, chewing tobacco, or electronic cigarettes.  If you are pregnant, do not drink alcohol.  If you are  breastfeeding, limit how much and how often you drink alcohol.  Limit alcohol intake to no more than 1 drink per day for nonpregnant women. One drink equals 12 ounces of beer, 5 ounces of wine, or 1 ounces of hard liquor.  Do not use street drugs.  Do not share needles.  Ask your health care provider for help if you need support or information about quitting drugs.  Tell your health care provider if you often feel depressed.  Tell your health care provider if you have ever been abused or do not feel safe at home. This information is not intended to replace advice given to you by your health care provider. Make sure you discuss any questions you have with your health care provider. Document Released: 11/11/2010 Document Revised: 10/04/2015 Document Reviewed: 01/30/2015 Elsevier Interactive Patient Education  [employer].

## 2017-06-23 NOTE — Patient Instructions (Signed)
Follow up in 1 year or as needed We'll notify you of your lab results and make any changes if needed Continue to work on healthy diet and regular exercise- you look great! Call with any questions or concerns Happy Early Birthday!!!

## 2017-06-24 ENCOUNTER — Encounter: Payer: Self-pay | Admitting: General Practice

## 2017-06-24 NOTE — Assessment & Plan Note (Signed)
Chronic problem.  Attempting to control w/ healthy diet and regular exercise.  Check labs.  Start meds prn. 

## 2017-06-24 NOTE — Assessment & Plan Note (Signed)
Pt has hx of this.  Check labs.  Replete prn. 

## 2017-06-24 NOTE — Assessment & Plan Note (Signed)
Pt's PE WNL.  UTD on cologuard, mammo.  DEXA ordered today.  Pneumovax given.  Check labs.  Anticipatory guidance provided.

## 2017-07-09 DIAGNOSIS — M8589 Other specified disorders of bone density and structure, multiple sites: Secondary | ICD-10-CM | POA: Diagnosis not present

## 2017-07-09 LAB — HM DEXA SCAN

## 2017-07-13 ENCOUNTER — Other Ambulatory Visit: Payer: Self-pay

## 2017-07-13 ENCOUNTER — Ambulatory Visit (INDEPENDENT_AMBULATORY_CARE_PROVIDER_SITE_OTHER): Payer: PPO | Admitting: Family Medicine

## 2017-07-13 ENCOUNTER — Encounter: Payer: Self-pay | Admitting: Family Medicine

## 2017-07-13 VITALS — BP 124/82 | HR 76 | Temp 98.2°F | Resp 16 | Ht 65.0 in | Wt 152.2 lb

## 2017-07-13 DIAGNOSIS — R0982 Postnasal drip: Secondary | ICD-10-CM | POA: Diagnosis not present

## 2017-07-13 DIAGNOSIS — R05 Cough: Secondary | ICD-10-CM | POA: Diagnosis not present

## 2017-07-13 DIAGNOSIS — R059 Cough, unspecified: Secondary | ICD-10-CM

## 2017-07-13 MED ORDER — CETIRIZINE HCL 10 MG PO TABS: 10.0000 mg | ORAL_TABLET | Freq: Every day | ORAL | 11 refills | Status: DC

## 2017-07-13 NOTE — Patient Instructions (Signed)
Follow up as needed or as scheduled The cough appears to be related to postnasal drip Drink PLENTY of fluids START daily Zyrtec to improve drainage To decrease the possibility of acid reflux, try and avoid a completely empty stomach, acidic or spicy foods Call with any questions or concerns- if symptoms persist, let me know and we'll get xrays Call with any questions or concerns Hang in there!!!

## 2017-07-13 NOTE — Progress Notes (Signed)
   Subjective:    Patient ID: Dawn Franco, female    DOB: 01/09/1951, 67 y.o.   MRN: [id number]  HPI Cough- 'for months'.  Pt denies feeling poorly.  Cough is not productive.  'it's not a deep cough, it's way up high in my throat'.  + PND.  Denies GERD.  Minimal nasal congestion.  Some improvement in cough w/ stopping coffee and wine.   Review of Systems For ROS see HPI     Objective:   Physical Exam  Constitutional: She is oriented to person, place, and time. She appears well-developed and well-nourished. No distress.  HENT:  Head: Normocephalic and atraumatic.  Right Ear: Tympanic membrane normal.  Left Ear: Tympanic membrane normal.  Nose: Mucosal edema and rhinorrhea present. Right sinus exhibits no maxillary sinus tenderness and no frontal sinus tenderness. Left sinus exhibits no maxillary sinus tenderness and no frontal sinus tenderness.  Mouth/Throat: Mucous membranes are normal. Posterior oropharyngeal erythema (w/ PND) present.  Eyes: Conjunctivae and EOM are normal. Pupils are equal, round, and reactive to light.  Neck: Normal range of motion. Neck supple.  Cardiovascular: Normal rate, regular rhythm and normal heart sounds.  Pulmonary/Chest: Effort normal and breath sounds normal. No respiratory distress. She has no wheezes. She has no rales.  Lymphadenopathy:    She has no cervical adenopathy.  Neurological: She is alert and oriented to person, place, and time.  Vitals reviewed.         Assessment & Plan:  Cough- new to provider, ongoing for pt.  Suspect this is due to her PND based on her sxs and PE.  Start daily antihistamine.  Also discussed the possible contribution of GERD- as evidenced by improvement when she eliminated coffee and alcohol.  Reviewed supportive care and red flags that should prompt return.  Pt expressed understanding and is in agreement w/ plan.

## 2017-07-21 ENCOUNTER — Encounter: Payer: Self-pay | Admitting: General Practice

## 2017-08-18 DIAGNOSIS — D1800 Hemangioma unspecified site: Secondary | ICD-10-CM | POA: Diagnosis not present

## 2017-08-18 DIAGNOSIS — L821 Other seborrheic keratosis: Secondary | ICD-10-CM | POA: Diagnosis not present

## 2017-08-18 DIAGNOSIS — M67471 Ganglion, right ankle and foot: Secondary | ICD-10-CM | POA: Diagnosis not present

## 2017-08-18 DIAGNOSIS — Z85828 Personal history of other malignant neoplasm of skin: Secondary | ICD-10-CM | POA: Diagnosis not present

## 2017-08-18 DIAGNOSIS — L814 Other melanin hyperpigmentation: Secondary | ICD-10-CM | POA: Diagnosis not present

## 2017-08-18 DIAGNOSIS — Z85820 Personal history of malignant melanoma of skin: Secondary | ICD-10-CM | POA: Diagnosis not present

## 2017-08-18 DIAGNOSIS — D225 Melanocytic nevi of trunk: Secondary | ICD-10-CM | POA: Diagnosis not present

## 2018-05-24 ENCOUNTER — Telehealth: Payer: Self-pay | Admitting: Family Medicine

## 2018-05-24 MED ORDER — ACETAZOLAMIDE 125 MG PO TABS: ORAL_TABLET | ORAL | 0 refills | Status: DC

## 2018-05-24 NOTE — Telephone Encounter (Signed)
Called and left a detailed message on pt voicemail to advise that rx was sent to pharmacy as requested.

## 2018-05-24 NOTE — Telephone Encounter (Signed)
Tell her to have a wonderful time and we'll start Diamox 125mg  BID 1-2 days before ascent and she should continue this for 5 days (6 or 7 days total) #14, no refills

## 2018-05-24 NOTE — Telephone Encounter (Signed)
Copied from [place] [id number]. Topic: Quick Communication - See Telephone Encounter >> May 24, 2018 10:06 AM Rutherford Nail, NT wrote: CRM for notification. See Telephone encounter for: 05/24/18. Patient calling and states that she is going on a trip and is needing high altitude sickness medications. Would like to know if Dr Birdie Riddle could prescribe this before Thursday? [id number] - [place], [place] - [address]#: [phone]

## 2018-05-24 NOTE — Telephone Encounter (Signed)
Pt stated she is going to [employer].

## 2018-05-24 NOTE — Telephone Encounter (Signed)
Please ask pt where she is going as we don't typically prescribe medication for altitude sickness

## 2018-05-24 NOTE — Telephone Encounter (Signed)
Called pt and LMOVM to return call to advise where she is going.   [facility] for [facility] to Discuss results / PCP recommendations / Schedule patient.

## 2018-05-24 NOTE — Telephone Encounter (Signed)
Please advise 

## 2018-06-17 DIAGNOSIS — Z1231 Encounter for screening mammogram for malignant neoplasm of breast: Secondary | ICD-10-CM | POA: Diagnosis not present

## 2018-06-17 LAB — HM MAMMOGRAPHY

## 2018-06-21 ENCOUNTER — Encounter: Payer: Self-pay | Admitting: Emergency Medicine

## 2018-06-29 NOTE — Progress Notes (Addendum)
Subjective:   Dawn Franco is a 68 y.o. female who presents for Medicare Annual (Subsequent) preventive examination.  Review of Systems:  No ROS.  Medicare Wellness Visit. Additional risk factors are reflected in the social history.  Cardiac Risk Factors include: advanced age (>61men, >81 women) Sleep patterns: Sleeps 7 hours.  Home Safety/Smoke Alarms: Feels safe in home. Smoke alarms in place.  Living environment; residence and Firearm Safety: Lives with husband in 2 story home.  Seat Belt Safety/Bike Helmet: Wears seat belt.   Female:   [id number]      Mammo-06/17/2018, negative.       Dexa scan-07/09/2017, Osteopenia.         CCS-Cologuard 07/01/16, negative.      Objective:     Vitals: BP (!) 154/82 (BP Location: Left Arm, Patient Position: Sitting, Cuff Size: Normal)   Pulse 68   Ht 5\' 5"  (1.651 m)   Wt 150 lb 4 oz (68.2 kg)   LMP 05/12/2001   SpO2 98%   BMI 25.00 kg/m   Body mass index is 25 kg/m.  Advanced Directives 06/30/2018 06/23/2017  Does Patient Have a Medical Advance Directive? Yes Yes  Type of Paramedic of [place];Living will Living will;Healthcare Power of [place] in Chart? Yes - validated most recent copy scanned in chart (See row information) No - copy requested    Tobacco Social History   Tobacco Use  Smoking Status Never Smoker  Smokeless Tobacco Never Used     Counseling given: Not Answered   Clinical Intake:     Pain : No/denies pain     Nutritional Status: BMI 25 -29 Overweight Nutritional Risks: None Diabetes: No           Past Medical History:  Diagnosis Date  . Basal cell carcinoma    forehead  . Chicken pox   . Heart murmur    Mitral Valve  . Melanoma ([place])    left arm    Past Surgical History:  Procedure Laterality Date  . local kin cancer removal    . MOHS SURGERY    . TONSILLECTOMY AND ADENOIDECTOMY  1957   Family History  Problem  Relation Age of Onset  . Hypertension Mother    Social History   Socioeconomic History  . Marital status: Married    Spouse name: Not on file  . Number of children: Not on file  . Years of education: Not on file  . Highest education level: Not on file  Occupational History  . Not on file  Social Needs  . Financial resource strain: Not on file  . Food insecurity:    Worry: Not on file    Inability: Not on file  . Transportation needs:    Medical: Not on file    Non-medical: Not on file  Tobacco Use  . Smoking status: Never Smoker  . Smokeless tobacco: Never Used  Substance and Sexual Activity  . Alcohol use: Yes    Alcohol/week: 0.0 standard drinks    Comment: Wine with meals  . Drug use: No  . Sexual activity: Not on file  Lifestyle  . Physical activity:    Days per week: Not on file    Minutes per session: Not on file  . Stress: Not on file  Relationships  . Social connections:    Talks on phone: Not on file    Gets together: Not on file    Attends  religious service: Not on file    Active member of club or organization: Not on file    Attends meetings of clubs or organizations: Not on file    Relationship status: Not on file  Other Topics Concern  . Not on file  Social History Narrative   She does not work    Married 37 years   Two children both are local ( Son 63, Daughter 56)    Outpatient Encounter Medications as of 06/30/2018  Medication Sig  . amoxicillin (AMOXIL) 500 MG capsule   . [DISCONTINUED] acetaZOLAMIDE (DIAMOX) 125 MG tablet Please take 1 tablet by mouth twice a day. Please begin this 1-2 days before ascent of [place] and she should continue this for 5 days (6 or 7 days total).  . [DISCONTINUED] cetirizine (ZYRTEC) 10 MG tablet Take 1 tablet (10 mg total) by mouth daily.  . [DISCONTINUED] scopolamine (TRANSDERM-SCOP) 1 MG/3DAYS Place 1 patch (1.5 mg total) onto the skin every 3 (three) days. (Patient not taking: Reported on 07/13/2017)   No  facility-administered encounter medications on file as of 06/30/2018.     Activities of Daily Living In your present state of health, do you have any difficulty performing the following activities: 06/30/2018  Hearing? N  Vision? N  Difficulty concentrating or making decisions? N  Walking or climbing stairs? N  Dressing or bathing? N  Doing errands, shopping? N  Preparing Food and eating ? N  Using the Toilet? N  In the past six months, have you accidently leaked urine? N  Do you have problems with loss of bowel control? N  Managing your Medications? N  Managing your Finances? N  Housekeeping or managing your Housekeeping? N  Some recent data might be hidden    Patient Care Team: Midge Minium, MD as PCP - General (Family Medicine) Katy Apo, MD as Consulting Physician (Ophthalmology) Renda Rolls, Jennefer Bravo, MD as Referring Physician (Dermatology) Azucena Fallen, MD as Consulting Physician (Obstetrics and Gynecology)    Assessment:   This is a routine wellness examination for Dawn Franco.  Exercise Activities and Dietary recommendations Current Exercise Habits: Structured exercise class(hiking), Type of exercise: yoga, Frequency (Times/Week): 3, Intensity: Moderate, Exercise limited by: None identified Diet (meal preparation, eat out, water intake, caffeinated beverages, dairy products, fruits and vegetables): Drinks water, hot tea and coffee (4-5 cups)  Heart Healthy diet.   Goals    . Patient Stated     Maintain current health by staying active.     . Patient Stated     Maintain current health.        Fall Risk Fall Risk  06/30/2018 06/23/2017 06/23/2017 06/23/2017 06/19/2016  Falls in the past year? 0 No No No No    Depression Screen PHQ 2/9 Scores 06/30/2018 06/23/2017 06/23/2017 06/19/2016  PHQ - 2 Score 0 0 0 0  PHQ- 9 Score - 0 - 0     Cognitive Function MMSE - Mini Mental State Exam 06/30/2018  Orientation to time 5  Orientation to Place 5  Registration 3    Attention/ Calculation 5  Recall 3  Language- name 2 objects 2  Language- repeat 1  Language- follow 3 step command 3  Language- read & follow direction 1  Write a sentence 1  Copy design 1  Total score 30        Immunization History  Administered Date(s) Administered  . Influenza, High Dose Seasonal PF 06/07/2016  . Influenza,inj,Quad PF,6+ Mos 03/12/2017  . Pneumococcal Conjugate-13  06/19/2016  . Pneumococcal Polysaccharide-23 06/23/2017  . Tdap 05/12/2006  . Zoster Recombinat (Shingrix) 03/25/2018    Screening Tests Health Maintenance  Topic Date Due  . TETANUS/TDAP  05/12/2016  . INFLUENZA VACCINE  12/10/2017  . MAMMOGRAM  06/18/2019  . Fecal DNA (Cologuard)  07/02/2019  . DEXA SCAN  Completed  . Hepatitis C Screening  Completed  . PNA vac Low Risk Adult  Completed        Plan:    Continue doing brain stimulating activities (puzzles, reading, adult coloring books, staying active) to keep memory sharp.   I have personally reviewed and noted the following in the patient's chart:   . Medical and social history . Use of alcohol, tobacco or illicit drugs  . Current medications and supplements . Functional ability and status . Nutritional status . Physical activity . Advanced directives . List of other physicians . Hospitalizations, surgeries, and ER visits in previous 12 months . Vitals . Screenings to include cognitive, depression, and falls . Referrals and appointments  In addition, I have reviewed and discussed with patient certain preventive protocols, quality metrics, and best practice recommendations. A written personalized care plan for preventive services as well as general preventive health recommendations were provided to patient.     Gerilyn Nestle, RN  06/30/2018  PCP Notes: -BP elevated. Pt upset with scheduling issue and just worked out 45 min.  -Bringing GYN records to next appt to determine need for Tetanus.  -F/U with PCP  07/01/2018  Reviewed documentation provided by RN and agree w/ above.  Will see pt tomorrow.  Annye Asa, MD

## 2018-06-30 ENCOUNTER — Other Ambulatory Visit: Payer: Self-pay

## 2018-06-30 ENCOUNTER — Ambulatory Visit (INDEPENDENT_AMBULATORY_CARE_PROVIDER_SITE_OTHER): Payer: PPO

## 2018-06-30 VITALS — BP 154/82 | HR 68 | Ht 65.0 in | Wt 150.2 lb

## 2018-06-30 DIAGNOSIS — Z0000 Encounter for general adult medical examination without abnormal findings: Secondary | ICD-10-CM

## 2018-06-30 NOTE — Patient Instructions (Addendum)
Continue doing brain stimulating activities (puzzles, reading, adult coloring books, staying active) to keep memory sharp.    Health Maintenance, Female Adopting a healthy lifestyle and getting preventive care can go a long way to promote health and wellness. Talk with your health care provider about what schedule of regular examinations is right for you. This is a good chance for you to check in with your provider about disease prevention and staying healthy. In between checkups, there are plenty of things you can do on your own. Experts have done a lot of research about which lifestyle changes and preventive measures are most likely to keep you healthy. Ask your health care provider for more information. Weight and diet Eat a healthy diet  Be sure to include plenty of vegetables, fruits, low-fat dairy products, and lean protein.  Do not eat a lot of foods high in solid fats, added sugars, or salt.  Get regular exercise. This is one of the most important things you can do for your health. ? Most adults should exercise for at least 150 minutes each week. The exercise should increase your heart rate and make you sweat (moderate-intensity exercise). ? Most adults should also do strengthening exercises at least twice a week. This is in addition to the moderate-intensity exercise. Maintain a healthy weight  Body mass index (BMI) is a measurement that can be used to identify possible weight problems. It estimates body fat based on height and weight. Your health care provider can help determine your BMI and help you achieve or maintain a healthy weight.  For females 20 years of age and older: ? A BMI below 18.5 is considered underweight. ? A BMI of 18.5 to 24.9 is normal. ? A BMI of 25 to 29.9 is considered overweight. ? A BMI of 30 and above is considered obese. Watch levels of cholesterol and blood lipids  You should start having your blood tested for lipids and cholesterol at 68 years of age,  then have this test every 5 years.  You may need to have your cholesterol levels checked more often if: ? Your lipid or cholesterol levels are high. ? You are older than 68 years of age. ? You are at high risk for heart disease. Cancer screening Lung Cancer  Lung cancer screening is recommended for adults 55-80 years old who are at high risk for lung cancer because of a history of smoking.  A yearly low-dose CT scan of the lungs is recommended for people who: ? Currently smoke. ? Have quit within the past 15 years. ? Have at least a 30-pack-year history of smoking. A pack year is smoking an average of one pack of cigarettes a day for 1 year.  Yearly screening should continue until it has been 15 years since you quit.  Yearly screening should stop if you develop a health problem that would prevent you from having lung cancer treatment. Breast Cancer  Practice breast self-awareness. This means understanding how your breasts normally appear and feel.  It also means doing regular breast self-exams. Let your health care provider know about any changes, no matter how small.  If you are in your 20s or 30s, you should have a clinical breast exam (CBE) by a health care provider every 1-3 years as part of a regular health exam.  If you are 40 or older, have a CBE every year. Also consider having a breast X-ray (mammogram) every year.  If you have a family history of breast cancer, talk   to your health care provider about genetic screening.  If you are at high risk for breast cancer, talk to your health care provider about having an MRI and a mammogram every year.  Breast cancer gene (BRCA) assessment is recommended for women who have family members with BRCA-related cancers. BRCA-related cancers include: ? Breast. ? Ovarian. ? Tubal. ? Peritoneal cancers.  Results of the assessment will determine the need for genetic counseling and BRCA1 and BRCA2 testing. Cervical Cancer Your health  care provider may recommend that you be screened regularly for cancer of the pelvic organs (ovaries, uterus, and vagina). This screening involves a pelvic examination, including checking for microscopic changes to the surface of your cervix (Pap test). You may be encouraged to have this screening done every 3 years, beginning at age 12.  For women ages 10-65, health care providers may recommend pelvic exams and Pap testing every 3 years, or they may recommend the Pap and pelvic exam, combined with testing for human papilloma virus (HPV), every 5 years. Some types of HPV increase your risk of cervical cancer. Testing for HPV may also be done on women of any age with unclear Pap test results.  Other health care providers may not recommend any screening for nonpregnant women who are considered low risk for pelvic cancer and who do not have symptoms. Ask your health care provider if a screening pelvic exam is right for you.  If you have had past treatment for cervical cancer or a condition that could lead to cancer, you need Pap tests and screening for cancer for at least 20 years after your treatment. If Pap tests have been discontinued, your risk factors (such as having a new sexual partner) need to be reassessed to determine if screening should resume. Some women have medical problems that increase the chance of getting cervical cancer. In these cases, your health care provider may recommend more frequent screening and Pap tests. Colorectal Cancer  This type of cancer can be detected and often prevented.  Routine colorectal cancer screening usually begins at 68 years of age and continues through 68 years of age.  Your health care provider may recommend screening at an earlier age if you have risk factors for colon cancer.  Your health care provider may also recommend using home test kits to check for hidden blood in the stool.  A small camera at the end of a tube can be used to examine your colon  directly (sigmoidoscopy or colonoscopy). This is done to check for the earliest forms of colorectal cancer.  Routine screening usually begins at age 50.  Direct examination of the colon should be repeated every 5-10 years through 68 years of age. However, you may need to be screened more often if early forms of precancerous polyps or small growths are found. Skin Cancer  Check your skin from head to toe regularly.  Tell your health care provider about any new moles or changes in moles, especially if there is a change in a mole's shape or color.  Also tell your health care provider if you have a mole that is larger than the size of a pencil eraser.  Always use sunscreen. Apply sunscreen liberally and repeatedly throughout the day.  Protect yourself by wearing long sleeves, pants, a wide-brimmed hat, and sunglasses whenever you are outside. Heart disease, diabetes, and high blood pressure  High blood pressure causes heart disease and increases the risk of stroke. High blood pressure is more likely to develop in: ?  People who have blood pressure in the high end of the normal range (130-139/85-89 mm Hg). ? People who are overweight or obese. ? People who are African American.  If you are 32-73 years of age, have your blood pressure checked every 3-5 years. If you are 32 years of age or older, have your blood pressure checked every year. You should have your blood pressure measured twice-once when you are at a hospital or clinic, and once when you are not at a hospital or clinic. Record the average of the two measurements. To check your blood pressure when you are not at a hospital or clinic, you can use: ? An automated blood pressure machine at a pharmacy. ? A home blood pressure monitor.  If you are between 62 years and 65 years old, ask your health care provider if you should take aspirin to prevent strokes.  Have regular diabetes screenings. This involves taking a blood sample to check  your fasting blood sugar level. ? If you are at a normal weight and have a low risk for diabetes, have this test once every three years after 68 years of age. ? If you are overweight and have a high risk for diabetes, consider being tested at a younger age or more often. Preventing infection Hepatitis B  If you have a higher risk for hepatitis B, you should be screened for this virus. You are considered at high risk for hepatitis B if: ? You were born in a country where hepatitis B is common. Ask your health care provider which countries are considered high risk. ? Your parents were born in a high-risk country, and you have not been immunized against hepatitis B (hepatitis B vaccine). ? You have HIV or AIDS. ? You use needles to inject street drugs. ? You live with someone who has hepatitis B. ? You have had sex with someone who has hepatitis B. ? You get hemodialysis treatment. ? You take certain medicines for conditions, including cancer, organ transplantation, and autoimmune conditions. Hepatitis C  Blood testing is recommended for: ? Everyone born from [birth year] through 1965. ? Anyone with known risk factors for hepatitis C. Sexually transmitted infections (STIs)  You should be screened for sexually transmitted infections (STIs) including gonorrhea and chlamydia if: ? You are sexually active and are younger than 68 years of age. ? You are older than 68 years of age and your health care provider tells you that you are at risk for this type of infection. ? Your sexual activity has changed since you were last screened and you are at an increased risk for chlamydia or gonorrhea. Ask your health care provider if you are at risk.  If you do not have HIV, but are at risk, it may be recommended that you take a prescription medicine daily to prevent HIV infection. This is called pre-exposure prophylaxis (PrEP). You are considered at risk if: ? You are sexually active and do not regularly use  condoms or know the HIV status of your partner(s). ? You take drugs by injection. ? You are sexually active with a partner who has HIV. Talk with your health care provider about whether you are at high risk of being infected with HIV. If you choose to begin PrEP, you should first be tested for HIV. You should then be tested every 3 months for as long as you are taking PrEP. Pregnancy  If you are premenopausal and you may become pregnant, ask your health care provider about  preconception counseling.  If you may become pregnant, take 400 to 800 micrograms (mcg) of folic acid every day.  If you want to prevent pregnancy, talk to your health care provider about birth control (contraception). Osteoporosis and menopause  Osteoporosis is a disease in which the bones lose minerals and strength with aging. This can result in serious bone fractures. Your risk for osteoporosis can be identified using a bone density scan.  If you are 22 years of age or older, or if you are at risk for osteoporosis and fractures, ask your health care provider if you should be screened.  Ask your health care provider whether you should take a calcium or vitamin D supplement to lower your risk for osteoporosis.  Menopause may have certain physical symptoms and risks.  Hormone replacement therapy may reduce some of these symptoms and risks. Talk to your health care provider about whether hormone replacement therapy is right for you. Follow these instructions at home:  Schedule regular health, dental, and eye exams.  Stay current with your immunizations.  Do not use any tobacco products including cigarettes, chewing tobacco, or electronic cigarettes.  If you are pregnant, do not drink alcohol.  If you are breastfeeding, limit how much and how often you drink alcohol.  Limit alcohol intake to no more than 1 drink per day for nonpregnant women. One drink equals 12 ounces of beer, 5 ounces of wine, or 1 ounces of  hard liquor.  Do not use street drugs.  Do not share needles.  Ask your health care provider for help if you need support or information about quitting drugs.  Tell your health care provider if you often feel depressed.  Tell your health care provider if you have ever been abused or do not feel safe at home. This information is not intended to replace advice given to you by your health care provider. Make sure you discuss any questions you have with your health care provider. Document Released: 11/11/2010 Document Revised: 10/04/2015 Document Reviewed: 01/30/2015 Elsevier Interactive Patient Education  2019 [employer].

## 2018-07-01 ENCOUNTER — Ambulatory Visit (INDEPENDENT_AMBULATORY_CARE_PROVIDER_SITE_OTHER): Payer: PPO | Admitting: Family Medicine

## 2018-07-01 ENCOUNTER — Other Ambulatory Visit: Payer: Self-pay

## 2018-07-01 ENCOUNTER — Encounter: Payer: Self-pay | Admitting: Family Medicine

## 2018-07-01 VITALS — BP 122/78 | HR 66 | Temp 98.1°F | Resp 17 | Ht 65.0 in | Wt 147.5 lb

## 2018-07-01 DIAGNOSIS — Z0000 Encounter for general adult medical examination without abnormal findings: Secondary | ICD-10-CM | POA: Diagnosis not present

## 2018-07-01 DIAGNOSIS — E785 Hyperlipidemia, unspecified: Secondary | ICD-10-CM

## 2018-07-01 DIAGNOSIS — E559 Vitamin D deficiency, unspecified: Secondary | ICD-10-CM | POA: Diagnosis not present

## 2018-07-01 LAB — HEPATIC FUNCTION PANEL
ALK PHOS: 70 U/L (ref 39–117)
ALT: 13 U/L (ref 0–35)
AST: 15 U/L (ref 0–37)
Albumin: 4.5 g/dL (ref 3.5–5.2)
Bilirubin, Direct: 0.1 mg/dL (ref 0.0–0.3)
Total Bilirubin: 0.6 mg/dL (ref 0.2–1.2)
Total Protein: 7.2 g/dL (ref 6.0–8.3)

## 2018-07-01 LAB — BASIC METABOLIC PANEL
BUN: 18 mg/dL (ref 6–23)
CO2: 27 meq/L (ref 19–32)
CREATININE: 0.78 mg/dL (ref 0.40–1.20)
Calcium: 9.5 mg/dL (ref 8.4–10.5)
Chloride: 102 mEq/L (ref 96–112)
GFR: 73.44 mL/min (ref >60.00)
Glucose, Bld: 96 mg/dL (ref 70–99)
Potassium: 4.9 mEq/L (ref 3.5–5.1)
Sodium: 139 mEq/L (ref 135–145)

## 2018-07-01 LAB — CBC WITH DIFFERENTIAL/PLATELET
Basophils Absolute: 0 10*3/uL (ref 0.0–0.1)
Basophils Relative: 0.8 % (ref 0.0–3.0)
Eosinophils Absolute: 0.1 10*3/uL (ref 0.0–0.7)
Eosinophils Relative: 2.5 % (ref 0.0–5.0)
HCT: 43.2 % (ref 36.0–46.0)
Hemoglobin: 14.7 g/dL (ref 12.0–15.0)
Lymphocytes Relative: 30.2 % (ref 12.0–46.0)
Lymphs Abs: 1.6 10*3/uL (ref 0.7–4.0)
MCHC: 34 g/dL (ref 30.0–36.0)
MCV: 94.5 fl (ref 78.0–100.0)
MONOS PCT: 8 % (ref 3.0–12.0)
Monocytes Absolute: 0.4 10*3/uL (ref 0.1–1.0)
Neutro Abs: 3.1 10*3/uL (ref 1.4–7.7)
Neutrophils Relative %: 58.5 % (ref 43.0–77.0)
Platelets: 375 10*3/uL (ref 150.0–400.0)
RBC: 4.57 Mil/uL (ref 3.87–5.11)
RDW: 13.9 % (ref 11.5–15.5)
WBC: 5.3 10*3/uL (ref 4.0–10.5)

## 2018-07-01 LAB — LIPID PANEL
Cholesterol: 228 mg/dL — ABNORMAL HIGH (ref 0–200)
HDL: 84.6 mg/dL (ref >39.00)
LDL Cholesterol: 131 mg/dL — ABNORMAL HIGH (ref 0–99)
NONHDL: 143.54
Total CHOL/HDL Ratio: 3
Triglycerides: 65 mg/dL (ref 0.0–149.0)
VLDL: 13 mg/dL (ref 0.0–40.0)

## 2018-07-01 LAB — VITAMIN D 25 HYDROXY (VIT D DEFICIENCY, FRACTURES): VITD: 38.83 ng/mL (ref 30.00–100.00)

## 2018-07-01 LAB — TSH: TSH: 1 u[IU]/mL (ref 0.35–4.50)

## 2018-07-01 NOTE — Assessment & Plan Note (Signed)
Pt's PE WNL.  UTD on mammo, cologuard, immunizations.  Check labs.  Anticipatory guidance provided.  

## 2018-07-01 NOTE — Patient Instructions (Signed)
Follow up in 1 year or as needed We'll notify you of your lab results and make any changes if needed Keep up the good work on healthy diet and regular exercise- you look great! Call with any questions or concerns Happy [place] Birthday!!

## 2018-07-01 NOTE — Progress Notes (Signed)
   Subjective:    Patient ID: Dawn Franco, female    DOB: 24-May-1950, 68 y.o.   MRN: [id number]  HPI CPE- UTD on mammo, cologuard, immunizations.   Review of Systems Patient reports no vision/ hearing changes, adenopathy,fever, weight change,  persistant/recurrent hoarseness , swallowing issues, chest pain, palpitations, edema, persistant/recurrent cough, hemoptysis, dyspnea (rest/exertional/paroxysmal nocturnal), gastrointestinal bleeding (melena, rectal bleeding), abdominal pain, significant heartburn, bowel changes, GU symptoms (dysuria, hematuria, incontinence), Gyn symptoms (abnormal  bleeding, pain),  syncope, focal weakness, memory loss, numbness & tingling, skin/hair/nail changes, abnormal bruising or bleeding, anxiety, or depression.     Objective:   Physical Exam General Appearance:    Alert, cooperative, no distress, appears stated age  Head:    Normocephalic, without obvious abnormality, atraumatic  Eyes:    PERRL, conjunctiva/corneas clear, EOM's intact, fundi    benign, both eyes  Ears:    Normal TM's and external ear canals, both ears  Nose:   Nares normal, septum midline, mucosa normal, no drainage    or sinus tenderness  Throat:   Lips, mucosa, and tongue normal; teeth and gums normal  Neck:   Supple, symmetrical, trachea midline, no adenopathy;    Thyroid: no enlargement/tenderness/nodules  Back:     Symmetric, no curvature, ROM normal, no CVA tenderness  Lungs:     Clear to auscultation bilaterally, respirations unlabored  Chest Wall:    No tenderness or deformity   Heart:    Regular rate and rhythm, S1 and S2 normal, no murmur, rub   or gallop  Breast Exam:    Deferred to mammo  Abdomen:     Soft, non-tender, bowel sounds active all four quadrants,    no masses, no organomegaly  Genitalia:    Deferred  Rectal:    Extremities:   Extremities normal, atraumatic, no cyanosis or edema  Pulses:   2+ and symmetric all extremities  Skin:   Skin color, texture,  turgor normal, no rashes or lesions  Lymph nodes:   Cervical, supraclavicular, and axillary nodes normal  Neurologic:   CNII-XII intact, normal strength, sensation and reflexes    throughout          Assessment & Plan:

## 2018-07-01 NOTE — Assessment & Plan Note (Signed)
Pt has hx of elevated cholesterol.  Repeat labs and determine if medication needed.

## 2018-07-01 NOTE — Assessment & Plan Note (Signed)
Pt w/ hx of this.  Check labs and replete prn.

## 2018-07-14 DIAGNOSIS — Z01419 Encounter for gynecological examination (general) (routine) without abnormal findings: Secondary | ICD-10-CM | POA: Diagnosis not present

## 2018-11-17 DIAGNOSIS — D225 Melanocytic nevi of trunk: Secondary | ICD-10-CM | POA: Diagnosis not present

## 2018-11-17 DIAGNOSIS — L814 Other melanin hyperpigmentation: Secondary | ICD-10-CM | POA: Diagnosis not present

## 2018-11-17 DIAGNOSIS — L821 Other seborrheic keratosis: Secondary | ICD-10-CM | POA: Diagnosis not present

## 2018-11-17 DIAGNOSIS — Z85820 Personal history of malignant melanoma of skin: Secondary | ICD-10-CM | POA: Diagnosis not present

## 2018-11-17 DIAGNOSIS — Z85828 Personal history of other malignant neoplasm of skin: Secondary | ICD-10-CM | POA: Diagnosis not present

## 2019-05-23 ENCOUNTER — Ambulatory Visit: Payer: Medicare Other | Attending: Internal Medicine

## 2019-05-23 DIAGNOSIS — Z23 Encounter for immunization: Secondary | ICD-10-CM

## 2019-05-23 NOTE — Progress Notes (Signed)
   Covid-19 Vaccination Clinic  Name:  Dawn Franco    MRN: [id number] DOB: 04-03-1951  05/23/2019  [name] was observed post Covid-19 immunization for 30 minutes based on pre-vaccination screening without incidence. She was provided with Vaccine Information Sheet and instruction to access the V-Safe system.   [name] was instructed to call 911 with any severe reactions post vaccine: Marland Kitchen Difficulty breathing  . Swelling of your face and throat  . A fast heartbeat  . A bad rash all over your body  . Dizziness and weakness    Immunizations Administered    Name Date Dose VIS Date Route   Pfizer COVID-19 Vaccine 05/23/2019  1:01 PM 0.3 mL 04/22/2019 Intramuscular   Manufacturer: [employer], [employer]   Lot: [id number]   [place]: [id number]

## 2019-06-13 ENCOUNTER — Ambulatory Visit: Payer: PPO | Attending: Internal Medicine

## 2019-06-13 ENCOUNTER — Ambulatory Visit: Payer: PPO

## 2019-06-13 DIAGNOSIS — Z23 Encounter for immunization: Secondary | ICD-10-CM | POA: Insufficient documentation

## 2019-06-13 NOTE — Progress Notes (Signed)
   Covid-19 Vaccination Clinic  Name:  Dawn Franco    MRN: [id number] DOB: 1950-07-04  06/13/2019  [name] was observed post Covid-19 immunization for 15 minutes without incidence. She was provided with Vaccine Information Sheet and instruction to access the V-Safe system.   [name] was instructed to call 911 with any severe reactions post vaccine: Marland Kitchen Difficulty breathing  . Swelling of your face and throat  . A fast heartbeat  . A bad rash all over your body  . Dizziness and weakness    Immunizations Administered    Name Date Dose VIS Date Route   Pfizer COVID-19 Vaccine 06/13/2019  8:29 AM 0.3 mL 04/22/2019 Intramuscular   Manufacturer: [place]   Lot: [id number]   [place]: [id number]

## 2019-07-07 ENCOUNTER — Ambulatory Visit (INDEPENDENT_AMBULATORY_CARE_PROVIDER_SITE_OTHER): Payer: PPO | Admitting: Family Medicine

## 2019-07-07 ENCOUNTER — Encounter: Payer: Self-pay | Admitting: Family Medicine

## 2019-07-07 ENCOUNTER — Other Ambulatory Visit: Payer: Self-pay

## 2019-07-07 VITALS — BP 122/80 | HR 76 | Temp 98.1°F | Resp 16 | Ht 65.0 in | Wt 154.0 lb

## 2019-07-07 DIAGNOSIS — E785 Hyperlipidemia, unspecified: Secondary | ICD-10-CM | POA: Diagnosis not present

## 2019-07-07 DIAGNOSIS — M810 Age-related osteoporosis without current pathological fracture: Secondary | ICD-10-CM

## 2019-07-07 DIAGNOSIS — Z0000 Encounter for general adult medical examination without abnormal findings: Secondary | ICD-10-CM

## 2019-07-07 DIAGNOSIS — E559 Vitamin D deficiency, unspecified: Secondary | ICD-10-CM

## 2019-07-07 DIAGNOSIS — Z1211 Encounter for screening for malignant neoplasm of colon: Secondary | ICD-10-CM

## 2019-07-07 LAB — HEPATIC FUNCTION PANEL
ALT: 20 U/L (ref 0–35)
AST: 21 U/L (ref 0–37)
Albumin: 4.5 g/dL (ref 3.5–5.2)
Alkaline Phosphatase: 74 U/L (ref 39–117)
Bilirubin, Direct: 0.1 mg/dL (ref 0.0–0.3)
Total Bilirubin: 0.6 mg/dL (ref 0.2–1.2)
Total Protein: 7.4 g/dL (ref 6.0–8.3)

## 2019-07-07 LAB — CBC WITH DIFFERENTIAL/PLATELET
Basophils Absolute: 0.1 10*3/uL (ref 0.0–0.1)
Basophils Relative: 1 % (ref 0.0–3.0)
Eosinophils Absolute: 0.2 10*3/uL (ref 0.0–0.7)
Eosinophils Relative: 2.8 % (ref 0.0–5.0)
HCT: 42.3 % (ref 36.0–46.0)
Hemoglobin: 14.4 g/dL (ref 12.0–15.0)
Lymphocytes Relative: 32.5 % (ref 12.0–46.0)
Lymphs Abs: 1.8 10*3/uL (ref 0.7–4.0)
MCHC: 34 g/dL (ref 30.0–36.0)
MCV: 95.5 fl (ref 78.0–100.0)
Monocytes Absolute: 0.4 10*3/uL (ref 0.1–1.0)
Monocytes Relative: 8.2 % (ref 3.0–12.0)
Neutro Abs: 3 10*3/uL (ref 1.4–7.7)
Neutrophils Relative %: 55.5 % (ref 43.0–77.0)
Platelets: 365 10*3/uL (ref 150.0–400.0)
RBC: 4.43 Mil/uL (ref 3.87–5.11)
RDW: 13.1 % (ref 11.5–15.5)
WBC: 5.4 10*3/uL (ref 4.0–10.5)

## 2019-07-07 LAB — BASIC METABOLIC PANEL
BUN: 18 mg/dL (ref 6–23)
CO2: 28 mEq/L (ref 19–32)
Calcium: 10.2 mg/dL (ref 8.4–10.5)
Chloride: 100 mEq/L (ref 96–112)
Creatinine, Ser: 0.85 mg/dL (ref 0.40–1.20)
GFR: 66.31 mL/min (ref >60.00)
Glucose, Bld: 99 mg/dL (ref 70–99)
Potassium: 5.7 mEq/L — ABNORMAL HIGH (ref 3.5–5.1)
Sodium: 136 mEq/L (ref 135–145)

## 2019-07-07 LAB — LIPID PANEL
Cholesterol: 232 mg/dL — ABNORMAL HIGH (ref 0–200)
HDL: 80.3 mg/dL (ref >39.00)
LDL Cholesterol: 137 mg/dL — ABNORMAL HIGH (ref 0–99)
NonHDL: 151.65
Total CHOL/HDL Ratio: 3
Triglycerides: 73 mg/dL (ref 0.0–149.0)
VLDL: 14.6 mg/dL (ref 0.0–40.0)

## 2019-07-07 LAB — VITAMIN D 25 HYDROXY (VIT D DEFICIENCY, FRACTURES): VITD: 42.25 ng/mL (ref 30.00–100.00)

## 2019-07-07 LAB — TSH: TSH: 1.51 u[IU]/mL (ref 0.35–4.50)

## 2019-07-07 NOTE — Progress Notes (Signed)
Subjective:    Patient ID: Dawn Franco, female    DOB: May 23, 1950, 69 y.o.   MRN: [id number]  HPI Here today for CPE and MWV.  No concerns today  Risk Factors: Hyperlipidemia- chronic problem, last LDL 131.  Attempting to control w/ diet and exercise Osteoporosis- due for DEXA Physical Activity: walking 45 minutes 3x/week Fall Risk: low falls Depression: denies current sxs Hearing: normal to conversational tones and whispered voice ADL's: independent Cognitive: normal linear thought process, memory and attention intact Home Safety: safe at home, lives w/ husband, children are local Height, Weight, BMI, Visual Acuity: see vitals, vision corrected to 20/20 w/ reading glasses Counseling: Due for cologuard, mammo/DEXA- pt to call and schedule.  UTD on immunizations including COVID vaccine Labs Ordered: See A&P Care Plan: See A&P   Health Maintenance  Topic Date Due   MAMMOGRAM  06/18/2019   Fecal DNA (Cologuard)  07/02/2019   TETANUS/TDAP  07/06/2020 (Originally 05/12/2016)   INFLUENZA VACCINE  Completed   DEXA SCAN  Completed   Hepatitis C Screening  Completed   PNA vac Low Risk Adult  Completed    Patient Care Team    Relationship Specialty Notifications Start End  Midge Minium, MD PCP - General Family Medicine  06/19/16   Katy Apo, MD Consulting Physician Ophthalmology  06/23/17   Devra Dopp, MD Referring Physician Dermatology  06/23/17   Azucena Fallen, MD Consulting Physician Obstetrics and Gynecology  06/23/17      Review of Systems Patient reports no vision/ hearing changes, adenopathy,fever, weight change,  persistant/recurrent hoarseness , swallowing issues, chest pain, palpitations, edema, persistant/recurrent cough, hemoptysis, dyspnea (rest/exertional/paroxysmal nocturnal), gastrointestinal bleeding (melena, rectal bleeding), abdominal pain, significant heartburn, bowel changes, GU symptoms (dysuria, hematuria, incontinence), Gyn  symptoms (abnormal  bleeding, pain),  syncope, focal weakness, memory loss, numbness & tingling, skin/hair/nail changes, abnormal bruising or bleeding, anxiety, or depression.   This visit occurred during the SARS-CoV-2 public health emergency.  Safety protocols were in place, including screening questions prior to the visit, additional usage of staff PPE, and extensive cleaning of exam room while observing appropriate contact time as indicated for disinfecting solutions.       Objective:   Physical Exam General Appearance:    Alert, cooperative, no distress, appears stated age  Head:    Normocephalic, without obvious abnormality, atraumatic  Eyes:    PERRL, conjunctiva/corneas clear, EOM's intact, fundi    benign, both eyes  Ears:    Normal TM's and external ear canals, both ears  Nose:   Deferred due to COVID  Throat:   Neck:   Supple, symmetrical, trachea midline, no adenopathy;    Thyroid: no enlargement/tenderness/nodules  Back:     Symmetric, no curvature, ROM normal, no CVA tenderness  Lungs:     Clear to auscultation bilaterally, respirations unlabored  Chest Wall:    No tenderness or deformity   Heart:    Regular rate and rhythm, S1 and S2 normal, no murmur, rub   or gallop  Breast Exam:    Deferred to GYN  Abdomen:     Soft, non-tender, bowel sounds active all four quadrants,    no masses, no organomegaly  Genitalia:    Deferred to GYN  Rectal:    Extremities:   Extremities normal, atraumatic, no cyanosis or edema  Pulses:   2+ and symmetric all extremities  Skin:   Skin color, texture, turgor normal, no rashes or lesions  Lymph nodes:   Cervical, supraclavicular,  and axillary nodes normal  Neurologic:   CNII-XII intact, normal strength, sensation and reflexes    throughout          Assessment & Plan:

## 2019-07-07 NOTE — Assessment & Plan Note (Signed)
Check labs and replete prn. 

## 2019-07-07 NOTE — Assessment & Plan Note (Signed)
Ongoing issue.  Attempting to control w/ diet and exercise.  Check labs and start meds prn.

## 2019-07-07 NOTE — Assessment & Plan Note (Signed)
Pt's PE WNL.  Due for repeat cologuard- ordered.  Due for repeat mammo and DEXA- ordered.  Applauded her efforts at regular exercise.  Check labs.

## 2019-07-07 NOTE — Patient Instructions (Signed)
Follow up in 1 year or as needed We'll notify you of your lab results and make any changes if needed Continue to work on healthy diet and regular exercise- you look great! Call and schedule your mammogram and bone density at your convenience Complete and return the Cologuard as directed Call with any questions or concerns Stay Safe!  Stay Healthy!   Preventive Care 69 Years and Older, Female Preventive care refers to lifestyle choices and visits with your health care provider that can promote health and wellness. This includes:  A yearly physical exam. This is also called an annual well check.  Regular dental and eye exams.  Immunizations.  Screening for certain conditions.  Healthy lifestyle choices, such as diet and exercise. What can I expect for my preventive care visit? Physical exam Your health care provider will check:  Height and weight. These may be used to calculate body mass index (BMI), which is a measurement that tells if you are at a healthy weight.  Heart rate and blood pressure.  Your skin for abnormal spots. Counseling Your health care provider may ask you questions about:  Alcohol, tobacco, and drug use.  Emotional well-being.  Home and relationship well-being.  Sexual activity.  Eating habits.  History of falls.  Memory and ability to understand (cognition).  Work and work Statistician.  Pregnancy and menstrual history. What immunizations do I need?  Influenza (flu) vaccine  This is recommended every year. Tetanus, diphtheria, and pertussis (Tdap) vaccine  You may need a Td booster every 10 years. Varicella (chickenpox) vaccine  You may need this vaccine if you have not already been vaccinated. Zoster (shingles) vaccine  You may need this after age 49. Pneumococcal conjugate (PCV13) vaccine  One dose is recommended after age 13. Pneumococcal polysaccharide (PPSV23) vaccine  One dose is recommended after age 73. Measles, mumps,  and rubella (MMR) vaccine  You may need at least one dose of MMR if you were born in 1957 or later. You may also need a second dose. Meningococcal conjugate (MenACWY) vaccine  You may need this if you have certain conditions. Hepatitis A vaccine  You may need this if you have certain conditions or if you travel or work in places where you may be exposed to hepatitis A. Hepatitis B vaccine  You may need this if you have certain conditions or if you travel or work in places where you may be exposed to hepatitis B. Haemophilus influenzae type b (Hib) vaccine  You may need this if you have certain conditions. You may receive vaccines as individual doses or as more than one vaccine together in one shot (combination vaccines). Talk with your health care provider about the risks and benefits of combination vaccines. What tests do I need? Blood tests  Lipid and cholesterol levels. These may be checked every 5 years, or more frequently depending on your overall health.  Hepatitis C test.  Hepatitis B test. Screening  Lung cancer screening. You may have this screening every year starting at age 31 if you have a 30-pack-year history of smoking and currently smoke or have quit within the past 15 years.  Colorectal cancer screening. All adults should have this screening starting at age 39 and continuing until age 4. Your health care provider may recommend screening at age 21 if you are at increased risk. You will have tests every 1-10 years, depending on your results and the type of screening test.  Diabetes screening. This is done by checking your  blood sugar (glucose) after you have not eaten for a while (fasting). You may have this done every 1-3 years.  Mammogram. This may be done every 1-2 years. Talk with your health care provider about how often you should have regular mammograms.  BRCA-related cancer screening. This may be done if you have a family history of breast, ovarian, tubal, or  peritoneal cancers. Other tests  Sexually transmitted disease (STD) testing.  Bone density scan. This is done to screen for osteoporosis. You may have this done starting at age 23. Follow these instructions at home: Eating and drinking  Eat a diet that includes fresh fruits and vegetables, whole grains, lean protein, and low-fat dairy products. Limit your intake of foods with high amounts of sugar, saturated fats, and salt.  Take vitamin and mineral supplements as recommended by your health care provider.  Do not drink alcohol if your health care provider tells you not to drink.  If you drink alcohol: ? Limit how much you have to 0-1 drink a day. ? Be aware of how much alcohol is in your drink. In the [place] one drink equals one 12 oz bottle of beer (355 mL), one 5 oz glass of wine (148 mL), or one 1 oz glass of hard liquor (44 mL). Lifestyle  Take daily care of your teeth and gums.  Stay active. Exercise for at least 30 minutes on 5 or more days each week.  Do not use any products that contain nicotine or tobacco, such as cigarettes, e-cigarettes, and chewing tobacco. If you need help quitting, ask your health care provider.  If you are sexually active, practice safe sex. Use a condom or other form of protection in order to prevent STIs (sexually transmitted infections).  Talk with your health care provider about taking a low-dose aspirin or statin. What's next?  Go to your health care provider once a year for a well check visit.  Ask your health care provider how often you should have your eyes and teeth checked.  Stay up to date on all vaccines. This information is not intended to replace advice given to you by your health care provider. Make sure you discuss any questions you have with your health care provider. Document Revised: 04/22/2018 Document Reviewed: 04/22/2018 Elsevier Patient Education  2020 [employer].

## 2019-07-07 NOTE — Assessment & Plan Note (Signed)
Due for repeat DEXA- ordered.

## 2019-07-08 ENCOUNTER — Other Ambulatory Visit: Payer: Self-pay | Admitting: Family Medicine

## 2019-07-08 DIAGNOSIS — E875 Hyperkalemia: Secondary | ICD-10-CM

## 2019-07-11 ENCOUNTER — Telehealth: Payer: Self-pay

## 2019-07-11 NOTE — Telephone Encounter (Signed)
Patient called in to find out if taking Melatonin 10mg  time released will interfere with her getting her Potassium levels checked. She previously tot them checked and they were elevated. She states at that time she had started eating a banana a day and started on the Melatonin. Please advise.

## 2019-07-11 NOTE — Telephone Encounter (Signed)
No issues with melatonin.  She can continue to take for sleep as needed

## 2019-07-11 NOTE — Telephone Encounter (Signed)
Patient aware of PCP instruction. She voiced understanding.

## 2019-07-11 NOTE — Telephone Encounter (Signed)
Please advise 

## 2019-07-12 ENCOUNTER — Other Ambulatory Visit: Payer: Self-pay

## 2019-07-12 ENCOUNTER — Ambulatory Visit (INDEPENDENT_AMBULATORY_CARE_PROVIDER_SITE_OTHER): Payer: PPO

## 2019-07-12 DIAGNOSIS — E875 Hyperkalemia: Secondary | ICD-10-CM | POA: Diagnosis not present

## 2019-07-12 LAB — BASIC METABOLIC PANEL
BUN: 23 mg/dL (ref 6–23)
CO2: 28 mEq/L (ref 19–32)
Calcium: 9.6 mg/dL (ref 8.4–10.5)
Chloride: 101 mEq/L (ref 96–112)
Creatinine, Ser: 0.89 mg/dL (ref 0.40–1.20)
GFR: 62.88 mL/min (ref >60.00)
Glucose, Bld: 65 mg/dL — ABNORMAL LOW (ref 70–99)
Potassium: 4.4 mEq/L (ref 3.5–5.1)
Sodium: 136 mEq/L (ref 135–145)

## 2019-07-13 ENCOUNTER — Encounter: Payer: Self-pay | Admitting: General Practice

## 2019-08-02 DIAGNOSIS — Z1231 Encounter for screening mammogram for malignant neoplasm of breast: Secondary | ICD-10-CM | POA: Diagnosis not present

## 2019-08-02 DIAGNOSIS — M85851 Other specified disorders of bone density and structure, right thigh: Secondary | ICD-10-CM | POA: Diagnosis not present

## 2019-08-02 DIAGNOSIS — M85852 Other specified disorders of bone density and structure, left thigh: Secondary | ICD-10-CM | POA: Diagnosis not present

## 2019-08-02 LAB — HM MAMMOGRAPHY

## 2019-08-02 LAB — HM DEXA SCAN

## 2019-08-08 ENCOUNTER — Encounter: Payer: Self-pay | Admitting: Family Medicine

## 2019-08-08 DIAGNOSIS — Z1211 Encounter for screening for malignant neoplasm of colon: Secondary | ICD-10-CM | POA: Diagnosis not present

## 2019-08-16 ENCOUNTER — Encounter: Payer: Self-pay | Admitting: General Practice

## 2019-08-18 LAB — COLOGUARD
COLOGUARD: NEGATIVE
Cologuard: NEGATIVE

## 2019-08-18 LAB — EXTERNAL GENERIC LAB PROCEDURE: COLOGUARD: NEGATIVE

## 2019-08-19 ENCOUNTER — Encounter: Payer: Self-pay | Admitting: General Practice

## 2019-10-11 DIAGNOSIS — L821 Other seborrheic keratosis: Secondary | ICD-10-CM | POA: Diagnosis not present

## 2019-12-12 DIAGNOSIS — Z85828 Personal history of other malignant neoplasm of skin: Secondary | ICD-10-CM | POA: Diagnosis not present

## 2019-12-12 DIAGNOSIS — D225 Melanocytic nevi of trunk: Secondary | ICD-10-CM | POA: Diagnosis not present

## 2019-12-12 DIAGNOSIS — L821 Other seborrheic keratosis: Secondary | ICD-10-CM | POA: Diagnosis not present

## 2019-12-12 DIAGNOSIS — Z85820 Personal history of malignant melanoma of skin: Secondary | ICD-10-CM | POA: Diagnosis not present

## 2019-12-12 DIAGNOSIS — M7130 Other bursal cyst, unspecified site: Secondary | ICD-10-CM | POA: Diagnosis not present

## 2019-12-12 DIAGNOSIS — L578 Other skin changes due to chronic exposure to nonionizing radiation: Secondary | ICD-10-CM | POA: Diagnosis not present

## 2019-12-12 DIAGNOSIS — L814 Other melanin hyperpigmentation: Secondary | ICD-10-CM | POA: Diagnosis not present

## 2020-05-22 ENCOUNTER — Telehealth: Payer: Self-pay | Admitting: Family Medicine

## 2020-05-22 NOTE — Telephone Encounter (Signed)
Left message for patient to schedule Annual Wellness Visit.  Please schedule with Nurse Health Advisor Martha Stanley, RN at [facility]  

## 2020-07-10 ENCOUNTER — Other Ambulatory Visit: Payer: Self-pay

## 2020-07-10 ENCOUNTER — Encounter: Payer: Self-pay | Admitting: Family Medicine

## 2020-07-10 ENCOUNTER — Ambulatory Visit (INDEPENDENT_AMBULATORY_CARE_PROVIDER_SITE_OTHER): Payer: PPO | Admitting: Family Medicine

## 2020-07-10 VITALS — BP 140/80 | HR 59 | Temp 97.7°F | Resp 19 | Ht 65.5 in | Wt 146.0 lb

## 2020-07-10 DIAGNOSIS — Z0000 Encounter for general adult medical examination without abnormal findings: Secondary | ICD-10-CM | POA: Diagnosis not present

## 2020-07-10 DIAGNOSIS — E785 Hyperlipidemia, unspecified: Secondary | ICD-10-CM

## 2020-07-10 DIAGNOSIS — E559 Vitamin D deficiency, unspecified: Secondary | ICD-10-CM

## 2020-07-10 LAB — BASIC METABOLIC PANEL
BUN: 20 mg/dL (ref 6–23)
CO2: 28 mEq/L (ref 19–32)
Calcium: 9.5 mg/dL (ref 8.4–10.5)
Chloride: 102 mEq/L (ref 96–112)
Creatinine, Ser: 0.8 mg/dL (ref 0.40–1.20)
GFR: 74.86 mL/min (ref >60.00)
Glucose, Bld: 96 mg/dL (ref 70–99)
Potassium: 4.1 mEq/L (ref 3.5–5.1)
Sodium: 136 mEq/L (ref 135–145)

## 2020-07-10 LAB — HEPATIC FUNCTION PANEL
ALT: 14 U/L (ref 0–35)
AST: 17 U/L (ref 0–37)
Albumin: 4.4 g/dL (ref 3.5–5.2)
Alkaline Phosphatase: 65 U/L (ref 39–117)
Bilirubin, Direct: 0.1 mg/dL (ref 0.0–0.3)
Total Bilirubin: 0.6 mg/dL (ref 0.2–1.2)
Total Protein: 7.4 g/dL (ref 6.0–8.3)

## 2020-07-10 LAB — LIPID PANEL
Cholesterol: 221 mg/dL — ABNORMAL HIGH (ref 0–200)
HDL: 90.1 mg/dL (ref >39.00)
LDL Cholesterol: 113 mg/dL — ABNORMAL HIGH (ref 0–99)
NonHDL: 130.41
Total CHOL/HDL Ratio: 2
Triglycerides: 87 mg/dL (ref 0.0–149.0)
VLDL: 17.4 mg/dL (ref 0.0–40.0)

## 2020-07-10 LAB — CBC WITH DIFFERENTIAL/PLATELET
Basophils Absolute: 0.1 10*3/uL (ref 0.0–0.1)
Basophils Relative: 1.5 % (ref 0.0–3.0)
Eosinophils Absolute: 0.1 10*3/uL (ref 0.0–0.7)
Eosinophils Relative: 2.7 % (ref 0.0–5.0)
HCT: 42 % (ref 36.0–46.0)
Hemoglobin: 14.4 g/dL (ref 12.0–15.0)
Lymphocytes Relative: 33 % (ref 12.0–46.0)
Lymphs Abs: 1.7 10*3/uL (ref 0.7–4.0)
MCHC: 34.3 g/dL (ref 30.0–36.0)
MCV: 95.3 fl (ref 78.0–100.0)
Monocytes Absolute: 0.4 10*3/uL (ref 0.1–1.0)
Monocytes Relative: 8.5 % (ref 3.0–12.0)
Neutro Abs: 2.7 10*3/uL (ref 1.4–7.7)
Neutrophils Relative %: 54.3 % (ref 43.0–77.0)
Platelets: 365 10*3/uL (ref 150.0–400.0)
RBC: 4.4 Mil/uL (ref 3.87–5.11)
RDW: 13.4 % (ref 11.5–15.5)
WBC: 5.1 10*3/uL (ref 4.0–10.5)

## 2020-07-10 LAB — TSH: TSH: 1.35 u[IU]/mL (ref 0.35–4.50)

## 2020-07-10 LAB — VITAMIN D 25 HYDROXY (VIT D DEFICIENCY, FRACTURES): VITD: 37.42 ng/mL (ref 30.00–100.00)

## 2020-07-10 NOTE — Assessment & Plan Note (Signed)
Pt's PE WNL.  UTD on cologuard, mammo, DEXA.  UTD on vaccines.  Check labs.  Anticipatory guidance provided.

## 2020-07-10 NOTE — Patient Instructions (Signed)
Follow up in 1 year or as needed We'll notify you of your lab results and make any changes if needed Keep up the good work on healthy diet and regular exercise- you look great!!! Call with any questions or concerns Stay Safe!  Stay Healthy! 

## 2020-07-10 NOTE — Assessment & Plan Note (Signed)
Chronic problem.  Attempting to control w/ diet and exercise.  Check labs and determine if medication is needed 

## 2020-07-10 NOTE — Assessment & Plan Note (Signed)
Pt has hx of this previously.  Check labs and replete prn.

## 2020-07-10 NOTE — Progress Notes (Signed)
   Subjective:    Patient ID: Dawn Franco, female    DOB: 1951/03/04, 70 y.o.   MRN: [id number]  HPI CPE- UTD on cologuard, mammo, DEXA.  UTD on pneumonia vaccines, flu, COVID (including booster)  Pt reports feeling good.  No concerns  Reviewed past medical, surgical, family and social histories.   Patient Care Team    Relationship Specialty Notifications Start End  Midge Minium, MD PCP - General Family Medicine  06/19/16   Katy Apo, MD Consulting Physician Ophthalmology  06/23/17   Devra Dopp, MD Referring Physician Dermatology  06/23/17   Azucena Fallen, MD Consulting Physician Obstetrics and Gynecology  06/23/17    Health Maintenance  Topic Date Due  . Samul Dada  05/12/2016  . INFLUENZA VACCINE  12/11/2019  . COVID-19 Vaccine (3 - Booster for Pfizer series) 12/11/2019  . MAMMOGRAM  08/01/2020  . Fecal DNA (Cologuard)  08/08/2022  . DEXA SCAN  Completed  . Hepatitis C Screening  Completed  . PNA vac Low Risk Adult  Completed  . HPV VACCINES  Aged Out      Review of Systems Patient reports no vision/ hearing changes, adenopathy,fever, persistant/recurrent hoarseness , swallowing issues, chest pain, palpitations, edema, persistant/recurrent cough, hemoptysis, dyspnea (rest/exertional/paroxysmal nocturnal), gastrointestinal bleeding (melena, rectal bleeding), abdominal pain, significant heartburn, bowel changes, GU symptoms (dysuria, hematuria, incontinence), Gyn symptoms (abnormal  bleeding, pain),  syncope, focal weakness, memory loss, numbness & tingling, skin/hair/nail changes, abnormal bruising or bleeding, anxiety, or depression.   + 10 lb intentional weight loss  This visit occurred during the SARS-CoV-2 public health emergency.  Safety protocols were in place, including screening questions prior to the visit, additional usage of staff PPE, and extensive cleaning of exam room while observing appropriate contact time as indicated for disinfecting  solutions.       Objective:   Physical Exam General Appearance:    Alert, cooperative, no distress, appears stated age  Head:    Normocephalic, without obvious abnormality, atraumatic  Eyes:    PERRL, conjunctiva/corneas clear, EOM's intact, fundi    benign, both eyes  Ears:    Normal TM's and external ear canals, both ears  Nose:   Deferred due to COVID  Throat:   Neck:   Supple, symmetrical, trachea midline, no adenopathy;    Thyroid: no enlargement/tenderness/nodules  Back:     Symmetric, no curvature, ROM normal, no CVA tenderness  Lungs:     Clear to auscultation bilaterally, respirations unlabored  Chest Wall:    No tenderness or deformity   Heart:    Regular rate and rhythm, S1 and S2 normal, no murmur, rub   or gallop  Breast Exam:    Deferred to mammo  Abdomen:     Soft, non-tender, bowel sounds active all four quadrants,    no masses, no organomegaly  Genitalia:    Deferred  Rectal:    Extremities:   Extremities normal, atraumatic, no cyanosis or edema  Pulses:   2+ and symmetric all extremities  Skin:   Skin color, texture, turgor normal, no rashes or lesions  Lymph nodes:   Cervical, supraclavicular, and axillary nodes normal  Neurologic:   CNII-XII intact, normal strength, sensation and reflexes    throughout          Assessment & Plan:

## 2020-07-23 ENCOUNTER — Ambulatory Visit: Payer: PPO

## 2020-08-02 DIAGNOSIS — Z1231 Encounter for screening mammogram for malignant neoplasm of breast: Secondary | ICD-10-CM | POA: Diagnosis not present

## 2020-08-09 DIAGNOSIS — Z20822 Contact with and (suspected) exposure to covid-19: Secondary | ICD-10-CM | POA: Diagnosis not present

## 2020-11-07 ENCOUNTER — Encounter: Payer: Self-pay | Admitting: *Deleted

## 2021-01-09 DIAGNOSIS — Z85828 Personal history of other malignant neoplasm of skin: Secondary | ICD-10-CM | POA: Diagnosis not present

## 2021-01-09 DIAGNOSIS — L820 Inflamed seborrheic keratosis: Secondary | ICD-10-CM | POA: Diagnosis not present

## 2021-01-09 DIAGNOSIS — L821 Other seborrheic keratosis: Secondary | ICD-10-CM | POA: Diagnosis not present

## 2021-01-09 DIAGNOSIS — L814 Other melanin hyperpigmentation: Secondary | ICD-10-CM | POA: Diagnosis not present

## 2021-01-09 DIAGNOSIS — L578 Other skin changes due to chronic exposure to nonionizing radiation: Secondary | ICD-10-CM | POA: Diagnosis not present

## 2021-01-09 DIAGNOSIS — D225 Melanocytic nevi of trunk: Secondary | ICD-10-CM | POA: Diagnosis not present

## 2021-01-09 DIAGNOSIS — Z85820 Personal history of malignant melanoma of skin: Secondary | ICD-10-CM | POA: Diagnosis not present

## 2021-02-20 ENCOUNTER — Telehealth: Payer: Self-pay | Admitting: Family Medicine

## 2021-02-20 NOTE — Telephone Encounter (Signed)
Called patient and left voicemail for family to return my call when possible.

## 2021-02-20 NOTE — Telephone Encounter (Signed)
..  Caller name:[facility] callback [phone]  Encourage patient to contact the pharmacy for refills or they can request refills through [facility]:  (Please schedule appointment if longer than 1 year)  NEXT APPOINTMENT DATE: March, 2023  MEDICATION NAME & DOSE: Amoxicillin 500 mg  Is the patient out of medication? YES/NO: Yes   [facility] [address] [facility] [place]  Let patient know to contact pharmacy at the end of the day to make sure medication is ready.  Please notify patient to allow 48-72 hours to process  (CLINICAL TO FILL OR ROUTE PER PROTOCOLS)

## 2021-02-28 ENCOUNTER — Telehealth: Payer: Self-pay

## 2021-02-28 NOTE — Telephone Encounter (Signed)
What does she take the Amoxicillin for?  A heart valve, artificial joint, etc?  Bc depending on the reason, the antibiotic recommendations before dental procedures have changed and she may not need medication

## 2021-02-28 NOTE — Telephone Encounter (Signed)
Patient called and stated that her dentist can no longer prescribe amoxicillan to her for dental work. Patient is suppose to take this med an hr before any dental procedure. Patient is willing to have a visit first if needed. She also stated that you use to prescribe her this before. Please advise

## 2021-02-28 NOTE — Telephone Encounter (Signed)
Patient states that the medication is for a classic mitral valve prolapse.

## 2021-03-01 ENCOUNTER — Other Ambulatory Visit: Payer: Self-pay

## 2021-03-01 DIAGNOSIS — I341 Nonrheumatic mitral (valve) prolapse: Secondary | ICD-10-CM

## 2021-03-01 MED ORDER — AMOXICILLIN 500 MG PO CAPS: ORAL_CAPSULE | ORAL | 0 refills | Status: DC

## 2021-03-01 NOTE — Telephone Encounter (Signed)
The new ADA (American Dental Association) guidelines no longer recommend antibiotics for mitral valve prolapse.  I know this can be a hard transition after life long use of antibiotics.  So if she feels more comfortable taking them, we can send Amoxicillin 500mg - take 4 tabs at least 1 hr prior to dental procedure.  #4, no refills.

## 2021-03-01 NOTE — Telephone Encounter (Signed)
Called and spoke with patient husband and notified him that per you The new American Dental Association guidelines no longer recommend antibiotics for mitral valve prolaspe. I also informed patient husband that if she feels comfortable taking them we can send in Amoxicillin 500mg , #4 no refills Take 4 tabs 1 hour prior to dental procedure. Patient husband understood. No further concerns at this time.

## 2021-07-12 ENCOUNTER — Ambulatory Visit (INDEPENDENT_AMBULATORY_CARE_PROVIDER_SITE_OTHER): Payer: PPO | Admitting: Family Medicine

## 2021-07-12 ENCOUNTER — Encounter: Payer: Self-pay | Admitting: Family Medicine

## 2021-07-12 VITALS — BP 112/80 | HR 60 | Temp 97.7°F | Resp 16 | Ht 65.5 in | Wt 135.0 lb

## 2021-07-12 DIAGNOSIS — Z85820 Personal history of malignant melanoma of skin: Secondary | ICD-10-CM | POA: Insufficient documentation

## 2021-07-12 DIAGNOSIS — L821 Other seborrheic keratosis: Secondary | ICD-10-CM | POA: Insufficient documentation

## 2021-07-12 DIAGNOSIS — L814 Other melanin hyperpigmentation: Secondary | ICD-10-CM | POA: Insufficient documentation

## 2021-07-12 DIAGNOSIS — Z0000 Encounter for general adult medical examination without abnormal findings: Secondary | ICD-10-CM

## 2021-07-12 DIAGNOSIS — D2230 Melanocytic nevi of unspecified part of face: Secondary | ICD-10-CM | POA: Insufficient documentation

## 2021-07-12 DIAGNOSIS — E785 Hyperlipidemia, unspecified: Secondary | ICD-10-CM

## 2021-07-12 DIAGNOSIS — E559 Vitamin D deficiency, unspecified: Secondary | ICD-10-CM

## 2021-07-12 DIAGNOSIS — D1800 Hemangioma unspecified site: Secondary | ICD-10-CM | POA: Insufficient documentation

## 2021-07-12 LAB — CBC WITH DIFFERENTIAL/PLATELET
Basophils Absolute: 0.1 10*3/uL (ref 0.0–0.1)
Basophils Relative: 1 % (ref 0.0–3.0)
Eosinophils Absolute: 0.1 10*3/uL (ref 0.0–0.7)
Eosinophils Relative: 2 % (ref 0.0–5.0)
HCT: 41.5 % (ref 36.0–46.0)
Hemoglobin: 14 g/dL (ref 12.0–15.0)
Lymphocytes Relative: 31.1 % (ref 12.0–46.0)
Lymphs Abs: 1.6 10*3/uL (ref 0.7–4.0)
MCHC: 33.6 g/dL (ref 30.0–36.0)
MCV: 95.8 fl (ref 78.0–100.0)
Monocytes Absolute: 0.5 10*3/uL (ref 0.1–1.0)
Monocytes Relative: 8.8 % (ref 3.0–12.0)
Neutro Abs: 3 10*3/uL (ref 1.4–7.7)
Neutrophils Relative %: 57.1 % (ref 43.0–77.0)
Platelets: 412 10*3/uL — ABNORMAL HIGH (ref 150.0–400.0)
RBC: 4.34 Mil/uL (ref 3.87–5.11)
RDW: 13.8 % (ref 11.5–15.5)
WBC: 5.2 10*3/uL (ref 4.0–10.5)

## 2021-07-12 LAB — HEPATIC FUNCTION PANEL
ALT: 17 U/L (ref 0–35)
AST: 18 U/L (ref 0–37)
Albumin: 4.6 g/dL (ref 3.5–5.2)
Alkaline Phosphatase: 62 U/L (ref 39–117)
Bilirubin, Direct: 0.1 mg/dL (ref 0.0–0.3)
Total Bilirubin: 0.8 mg/dL (ref 0.2–1.2)
Total Protein: 7 g/dL (ref 6.0–8.3)

## 2021-07-12 LAB — LIPID PANEL
Cholesterol: 218 mg/dL — ABNORMAL HIGH (ref 0–200)
HDL: 75.9 mg/dL (ref >39.00)
LDL Cholesterol: 127 mg/dL — ABNORMAL HIGH (ref 0–99)
NonHDL: 141.96
Total CHOL/HDL Ratio: 3
Triglycerides: 75 mg/dL (ref 0.0–149.0)
VLDL: 15 mg/dL (ref 0.0–40.0)

## 2021-07-12 LAB — BASIC METABOLIC PANEL
BUN: 17 mg/dL (ref 6–23)
CO2: 29 mEq/L (ref 19–32)
Calcium: 9.4 mg/dL (ref 8.4–10.5)
Chloride: 101 mEq/L (ref 96–112)
Creatinine, Ser: 0.75 mg/dL (ref 0.40–1.20)
GFR: 80.32 mL/min (ref >60.00)
Glucose, Bld: 94 mg/dL (ref 70–99)
Potassium: 4.6 mEq/L (ref 3.5–5.1)
Sodium: 137 mEq/L (ref 135–145)

## 2021-07-12 LAB — VITAMIN D 25 HYDROXY (VIT D DEFICIENCY, FRACTURES): VITD: 38.8 ng/mL (ref 30.00–100.00)

## 2021-07-12 LAB — TSH: TSH: 1.28 u[IU]/mL (ref 0.35–5.50)

## 2021-07-12 NOTE — Assessment & Plan Note (Signed)
Pt's PE WNL.  UTD on mammo, cologuard, PNA, flu.  Check labs.  Anticipatory guidance provided.  

## 2021-07-12 NOTE — Assessment & Plan Note (Signed)
Check labs and replete prn. 

## 2021-07-12 NOTE — Assessment & Plan Note (Signed)
Pt has been able to control w/ diet and exercise and since last visit has lost 11 lbs!  Applauded her efforts.  Check labs.  No anticipated change in tx plan ?

## 2021-07-12 NOTE — Progress Notes (Signed)
? ?  Subjective:  ? ? Patient ID: Dawn Franco, female    DOB: 01-16-1951, 71 y.o.   MRN: [id number] ? ?HPI ?CPE- UTD on cologuard, PNA, flu, DEXA, mammo ? ?Patient Care Team  ?  Relationship Specialty Notifications Start End  ?Midge Minium, MD PCP - General Family Medicine  06/19/16   ?Katy Apo, MD Consulting Physician Ophthalmology  06/23/17   ?Haverstock, Jennefer Bravo, MD Referring Physician Dermatology  06/23/17   ?Azucena Fallen, MD Consulting Physician Obstetrics and Gynecology  06/23/17   ?  ?Health Maintenance  ?Topic Date Due  ? Zoster Vaccines- Shingrix (2 of 2) 05/20/2018  ? COVID-19 Vaccine (4 - Booster for Pfizer series) 04/04/2020  ? MAMMOGRAM  08/01/2020  ? TETANUS/TDAP  07/13/2022 (Originally 05/12/2016)  ? Fecal DNA (Cologuard)  08/08/2022  ? Pneumonia Vaccine 54+ Years old  Completed  ? INFLUENZA VACCINE  Completed  ? DEXA SCAN  Completed  ? Hepatitis C Screening  Completed  ? HPV VACCINES  Aged Out  ?  ? ? ?Review of Systems ?Patient reports no vision/ hearing changes, adenopathy,fever, persistant/recurrent hoarseness , swallowing issues, chest pain, palpitations, edema, persistant/recurrent cough, hemoptysis, dyspnea (rest/exertional/paroxysmal nocturnal), gastrointestinal bleeding (melena, rectal bleeding), abdominal pain, significant heartburn, bowel changes, GU symptoms (dysuria, hematuria, incontinence), Gyn symptoms (abnormal  bleeding, pain),  syncope, focal weakness, memory loss, numbness & tingling, skin/hair/nail changes, abnormal bruising or bleeding, anxiety, or depression.  ? ?+ 11 lb intentional weight loss- pt has increased amount of exercise ? ?This visit occurred during the SARS-CoV-2 public health emergency.  Safety protocols were in place, including screening questions prior to the visit, additional usage of staff PPE, and extensive cleaning of exam room while observing appropriate contact time as indicated for disinfecting solutions.   ?   ?Objective:  ? Physical  Exam ?General Appearance:    Alert, cooperative, no distress, appears stated age  ?Head:    Normocephalic, without obvious abnormality, atraumatic  ?Eyes:    PERRL, conjunctiva/corneas clear, EOM's intact, fundi  ?  benign, both eyes  ?Ears:    Normal TM's and external ear canals, both ears  ?Nose:   Deferred due to COVID  ?Throat:   ?Neck:   Supple, symmetrical, trachea midline, no adenopathy;  ?  Thyroid: no enlargement/tenderness/nodules  ?Back:     Symmetric, no curvature, ROM normal, no CVA tenderness  ?Lungs:     Clear to auscultation bilaterally, respirations unlabored  ?Chest Wall:    No tenderness or deformity  ? Heart:    Regular rate and rhythm, S1 and S2 normal, no murmur, rub ?  or gallop  ?Breast Exam:    Deferred to mammo  ?Abdomen:     Soft, non-tender, bowel sounds active all four quadrants,  ?  no masses, no organomegaly  ?Genitalia:    Deferred to GYN  ?Rectal:    ?Extremities:   Extremities normal, atraumatic, no cyanosis or edema  ?Pulses:   2+ and symmetric all extremities  ?Skin:   Skin color, texture, turgor normal, no rashes or lesions  ?Lymph nodes:   Cervical, supraclavicular, and axillary nodes normal  ?Neurologic:   CNII-XII intact, normal strength, sensation and reflexes  ?  throughout  ?  ? ? ? ?   ?Assessment & Plan:  ? ? ?

## 2021-07-12 NOTE — Patient Instructions (Signed)
Follow up in 1 year or as needed ?We'll notify you of your lab results and make any changes if needed ?Keep up the good work on healthy diet and regular exercise- you look great!!! ?Call with any questions or concerns ?Stay Safe!  Stay Healthy! ?Enjoy your travels!!! ?

## 2021-08-01 ENCOUNTER — Other Ambulatory Visit: Payer: Self-pay

## 2021-08-01 ENCOUNTER — Telehealth: Payer: Self-pay

## 2021-08-01 MED ORDER — SCOPOLAMINE 1 MG/3DAYS TD PT72: 1.0000 | MEDICATED_PATCH | TRANSDERMAL | 0 refills | Status: DC

## 2021-08-01 NOTE — Telephone Encounter (Signed)
?  Encourage patient to contact the pharmacy for refills or they can request refills through [facility] ? ?LAST APPOINTMENT DATE:  07/12/21 ? ?NEXT APPOINTMENT DATE: ? ?MEDICATION: Motion sickness patch ? ?Is the patient out of medication? yes ? ?[id number] [place] [address] [place]? ? ? ? ?  ?

## 2021-08-01 NOTE — Telephone Encounter (Signed)
Rx sent 

## 2021-08-08 DIAGNOSIS — Z1231 Encounter for screening mammogram for malignant neoplasm of breast: Secondary | ICD-10-CM | POA: Diagnosis not present

## 2021-08-08 LAB — HM MAMMOGRAPHY

## 2021-09-04 ENCOUNTER — Encounter: Payer: Self-pay | Admitting: Family Medicine

## 2021-09-04 ENCOUNTER — Ambulatory Visit (INDEPENDENT_AMBULATORY_CARE_PROVIDER_SITE_OTHER): Payer: PPO | Admitting: Family Medicine

## 2021-09-04 VITALS — BP 134/82 | HR 73 | Temp 98.0°F | Resp 16 | Ht 65.0 in | Wt 136.0 lb

## 2021-09-04 DIAGNOSIS — N8110 Cystocele, unspecified: Secondary | ICD-10-CM

## 2021-09-04 NOTE — Patient Instructions (Addendum)
Keep your appt with Dr Benjie Karvonen tomorrow ?Since the symptoms are vaginal, she will be the one to determine the next steps ?Call with any questions or concerns ?Stay Safe!  Stay Healthy! ?Welcome Back!!! ? ?

## 2021-09-04 NOTE — Progress Notes (Signed)
? ?  Subjective:  ? ? Patient ID: Dawn Franco, female    DOB: 11/25/1950, 71 y.o.   MRN: [id number] ? ?HPI ?'it feels like intestines in my vagina'- First noticed ~4 weeks ago.  Pt has been traveling for last 3 weeks and done a lot of walking.  Pt was told she had beginning of uterine prolapse by GYN.  Has appt w/ Dr Benjie Karvonen tomorrow.  Pt reports having to 'push things back up'.  Denies pain or difficulty w/ using the bathroom.  Pt reports increased feeling of pressure when she coughs or sneezes. ? ? ?Review of Systems ?For ROS see HPI  ?   ?Objective:  ? Physical Exam ?Vitals reviewed.  ?Constitutional:   ?   General: She is not in acute distress. ?   Appearance: Normal appearance. She is not ill-appearing.  ?HENT:  ?   Head: Normocephalic and atraumatic.  ?Genitourinary: ?   Comments: Pt deferred exam today since she has GYN appt tomorrow ?Skin: ?   General: Skin is warm and dry.  ?Neurological:  ?   General: No focal deficit present.  ?   Mental Status: She is alert and oriented to person, place, and time.  ?Psychiatric:     ?   Mood and Affect: Mood normal.     ?   Behavior: Behavior normal.     ?   Thought Content: Thought content normal.  ? ? ? ? ? ?   ?Assessment & Plan:  ? ?Vaginal prolapse- new.  Unclear as to what is actually prolapsed into the vaginal vault as pt deferred exam to GYN tomorrow.  Thankful she has that appt scheduled as that's where she needs to be.  Will follow along and assist as able. ?

## 2021-09-05 DIAGNOSIS — N8110 Cystocele, unspecified: Secondary | ICD-10-CM | POA: Diagnosis not present

## 2021-09-05 DIAGNOSIS — N814 Uterovaginal prolapse, unspecified: Secondary | ICD-10-CM | POA: Diagnosis not present

## 2021-09-05 DIAGNOSIS — N952 Postmenopausal atrophic vaginitis: Secondary | ICD-10-CM | POA: Diagnosis not present

## 2021-09-10 ENCOUNTER — Encounter: Payer: Self-pay | Admitting: Family Medicine

## 2021-09-25 DIAGNOSIS — N952 Postmenopausal atrophic vaginitis: Secondary | ICD-10-CM | POA: Diagnosis not present

## 2021-09-25 DIAGNOSIS — N8110 Cystocele, unspecified: Secondary | ICD-10-CM | POA: Diagnosis not present

## 2021-09-25 DIAGNOSIS — N814 Uterovaginal prolapse, unspecified: Secondary | ICD-10-CM | POA: Diagnosis not present

## 2021-12-11 DIAGNOSIS — Z4689 Encounter for fitting and adjustment of other specified devices: Secondary | ICD-10-CM | POA: Diagnosis not present

## 2022-01-07 DIAGNOSIS — D225 Melanocytic nevi of trunk: Secondary | ICD-10-CM | POA: Diagnosis not present

## 2022-01-07 DIAGNOSIS — L814 Other melanin hyperpigmentation: Secondary | ICD-10-CM | POA: Diagnosis not present

## 2022-01-07 DIAGNOSIS — L578 Other skin changes due to chronic exposure to nonionizing radiation: Secondary | ICD-10-CM | POA: Diagnosis not present

## 2022-01-07 DIAGNOSIS — Z85820 Personal history of malignant melanoma of skin: Secondary | ICD-10-CM | POA: Diagnosis not present

## 2022-01-07 DIAGNOSIS — L821 Other seborrheic keratosis: Secondary | ICD-10-CM | POA: Diagnosis not present

## 2022-01-07 DIAGNOSIS — Z85828 Personal history of other malignant neoplasm of skin: Secondary | ICD-10-CM | POA: Diagnosis not present

## 2022-04-01 ENCOUNTER — Telehealth: Payer: Self-pay | Admitting: Family Medicine

## 2022-04-01 NOTE — Telephone Encounter (Signed)
Left message for patient to call back and schedule Medicare Annual Wellness Visit (AWV) in office.   If not able to come in office, please offer to do virtually or by telephone.  Left office number and my jabber #[phone].  Last AWV:07/07/2019   Please schedule at anytime with Nurse Health Advisor.  

## 2022-04-17 ENCOUNTER — Encounter: Payer: Self-pay | Admitting: Family Medicine

## 2022-04-17 ENCOUNTER — Ambulatory Visit (INDEPENDENT_AMBULATORY_CARE_PROVIDER_SITE_OTHER): Payer: PPO | Admitting: Family Medicine

## 2022-04-17 VITALS — BP 134/80 | HR 81 | Temp 97.9°F | Resp 16 | Ht 65.0 in | Wt 139.1 lb

## 2022-04-17 DIAGNOSIS — J3489 Other specified disorders of nose and nasal sinuses: Secondary | ICD-10-CM | POA: Diagnosis not present

## 2022-04-17 MED ORDER — MUPIROCIN 2 % EX OINT
1.0000 | TOPICAL_OINTMENT | Freq: Two times a day (BID) | CUTANEOUS | 0 refills | Status: DC
Start: 2022-04-17 — End: 2022-07-15

## 2022-04-17 NOTE — Patient Instructions (Addendum)
Follow up as needed or as scheduled Apply Mupirocin in the affected nostril twice daily If no improvement, please let me know!!! Call with any questions or concerns Stay Safe!!  Stay Healthy!! Happy Holidays!!!

## 2022-04-17 NOTE — Progress Notes (Signed)
   Subjective:    Patient ID: Dawn Franco, female    DOB: 06/28/1950, 71 y.o.   MRN: [id number]  HPI Sore in nose- pt reports sxs started 'more than a month' ago.  Has tried A&D ointment and vaseline.  Area is sore, but not bleeding.  + hx of PND.  R nostril, at base.  No fever.  No recent cold but has environmental allergies.   Review of Systems For ROS see HPI     Objective:   Physical Exam Vitals reviewed.  Constitutional:      General: She is not in acute distress.    Appearance: Normal appearance. She is not ill-appearing.  HENT:     Head: Normocephalic and atraumatic.     Nose: Congestion present. No rhinorrhea.     Comments: Small whitehead just inside R nostril laterally, mild TTP Lymphadenopathy:     Cervical: No cervical adenopathy.  Skin:    General: Skin is warm and dry.  Neurological:     General: No focal deficit present.     Mental Status: She is alert and oriented to person, place, and time.  Psychiatric:        Mood and Affect: Mood normal.        Behavior: Behavior normal.        Thought Content: Thought content normal.           Assessment & Plan:  Nasal sore- new.  Start mupirocin ointment twice daily.  If no improvement, pt to notify me so we can refer to ENT.  Pt expressed understanding and is in agreement w/ plan.

## 2022-07-15 ENCOUNTER — Encounter: Payer: Self-pay | Admitting: Family Medicine

## 2022-07-15 ENCOUNTER — Ambulatory Visit (INDEPENDENT_AMBULATORY_CARE_PROVIDER_SITE_OTHER): Payer: PPO | Admitting: Family Medicine

## 2022-07-15 VITALS — BP 124/76 | HR 65 | Temp 97.9°F | Resp 17 | Ht 65.0 in | Wt 137.0 lb

## 2022-07-15 DIAGNOSIS — E785 Hyperlipidemia, unspecified: Secondary | ICD-10-CM

## 2022-07-15 DIAGNOSIS — Z0000 Encounter for general adult medical examination without abnormal findings: Secondary | ICD-10-CM

## 2022-07-15 DIAGNOSIS — E559 Vitamin D deficiency, unspecified: Secondary | ICD-10-CM

## 2022-07-15 LAB — BASIC METABOLIC PANEL
BUN: 20 mg/dL (ref 6–23)
CO2: 26 mEq/L (ref 19–32)
Calcium: 9.8 mg/dL (ref 8.4–10.5)
Chloride: 102 mEq/L (ref 96–112)
Creatinine, Ser: 0.74 mg/dL (ref 0.40–1.20)
GFR: 81.04 mL/min (ref >60.00)
Glucose, Bld: 84 mg/dL (ref 70–99)
Potassium: 4.1 mEq/L (ref 3.5–5.1)
Sodium: 138 mEq/L (ref 135–145)

## 2022-07-15 LAB — LIPID PANEL
Cholesterol: 213 mg/dL — ABNORMAL HIGH (ref 0–200)
HDL: 83 mg/dL (ref >39.00)
LDL Cholesterol: 115 mg/dL — ABNORMAL HIGH (ref 0–99)
NonHDL: 130.06
Total CHOL/HDL Ratio: 3
Triglycerides: 73 mg/dL (ref 0.0–149.0)
VLDL: 14.6 mg/dL (ref 0.0–40.0)

## 2022-07-15 LAB — CBC WITH DIFFERENTIAL/PLATELET
Basophils Absolute: 0.1 10*3/uL (ref 0.0–0.1)
Basophils Relative: 1 % (ref 0.0–3.0)
Eosinophils Absolute: 0.1 10*3/uL (ref 0.0–0.7)
Eosinophils Relative: 2.4 % (ref 0.0–5.0)
HCT: 43.3 % (ref 36.0–46.0)
Hemoglobin: 14.9 g/dL (ref 12.0–15.0)
Lymphocytes Relative: 31.5 % (ref 12.0–46.0)
Lymphs Abs: 1.8 10*3/uL (ref 0.7–4.0)
MCHC: 34.3 g/dL (ref 30.0–36.0)
MCV: 95.8 fl (ref 78.0–100.0)
Monocytes Absolute: 0.5 10*3/uL (ref 0.1–1.0)
Monocytes Relative: 8.2 % (ref 3.0–12.0)
Neutro Abs: 3.3 10*3/uL (ref 1.4–7.7)
Neutrophils Relative %: 56.9 % (ref 43.0–77.0)
Platelets: 417 10*3/uL — ABNORMAL HIGH (ref 150.0–400.0)
RBC: 4.52 Mil/uL (ref 3.87–5.11)
RDW: 13.8 % (ref 11.5–15.5)
WBC: 5.8 10*3/uL (ref 4.0–10.5)

## 2022-07-15 LAB — TSH: TSH: 1.87 u[IU]/mL (ref 0.35–5.50)

## 2022-07-15 LAB — HEPATIC FUNCTION PANEL
ALT: 23 U/L (ref 0–35)
AST: 28 U/L (ref 0–37)
Albumin: 4.5 g/dL (ref 3.5–5.2)
Alkaline Phosphatase: 65 U/L (ref 39–117)
Bilirubin, Direct: 0.1 mg/dL (ref 0.0–0.3)
Total Bilirubin: 0.6 mg/dL (ref 0.2–1.2)
Total Protein: 7.4 g/dL (ref 6.0–8.3)

## 2022-07-15 LAB — VITAMIN D 25 HYDROXY (VIT D DEFICIENCY, FRACTURES): VITD: 26.6 ng/mL — ABNORMAL LOW (ref 30.00–100.00)

## 2022-07-15 NOTE — Patient Instructions (Addendum)
Follow up in 1 year or as needed We'll notify you of your lab results and make any changes if needed Keep up the good work on healthy diet and regular exercise- you look great!! Call with any questions or concerns Stay Safe!  Stay Healthy! Enjoy your trip!!!

## 2022-07-15 NOTE — Assessment & Plan Note (Signed)
Check labs and replete prn. 

## 2022-07-15 NOTE — Assessment & Plan Note (Signed)
Chronic problem.  Has been able to control w/ diet and exercise.  Check labs and determine if medication is needed

## 2022-07-15 NOTE — Progress Notes (Signed)
   Subjective:    Patient ID: Dawn Franco, female    DOB: 03-Nov-1950, 72 y.o.   MRN: [id number]  HPI CPE- UTD on mammo, cologuard, PNA, flu.  Pt reports feeling good.  No concerns today.  Patient Care Team    Relationship Specialty Notifications Start End  Midge Minium, MD PCP - General Family Medicine  06/19/16   Katy Apo, MD Consulting Physician Ophthalmology  06/23/17   Devra Dopp, MD Referring Physician Dermatology  06/23/17   Azucena Fallen, MD Consulting Physician Obstetrics and Gynecology  06/23/17     Health Maintenance  Topic Date Due   Medicare Annual Wellness (AWV)  07/01/2019   Fecal DNA (Cologuard)  08/08/2022   MAMMOGRAM  08/09/2022   Pneumonia Vaccine 18+ Years old  Completed   INFLUENZA VACCINE  Completed   DEXA SCAN  Completed   Hepatitis C Screening  Completed   HPV VACCINES  Aged Out   DTaP/Tdap/Td  Discontinued   COVID-19 Vaccine  Discontinued   Zoster Vaccines- Shingrix  Discontinued      Review of Systems Patient reports no vision/ hearing changes, adenopathy,fever, weight change,  persistant/recurrent hoarseness , swallowing issues, chest pain, palpitations, edema, persistant/recurrent cough, hemoptysis, dyspnea (rest/exertional/paroxysmal nocturnal), gastrointestinal bleeding (melena, rectal bleeding), abdominal pain, significant heartburn, bowel changes, GU symptoms (dysuria, hematuria, incontinence), Gyn symptoms (abnormal  bleeding, pain),  syncope, focal weakness, memory loss, numbness & tingling, skin/hair/nail changes, abnormal bruising or bleeding, anxiety, or depression.     Objective:   Physical Exam General Appearance:    Alert, cooperative, no distress, appears stated age  Head:    Normocephalic, without obvious abnormality, atraumatic  Eyes:    PERRL, conjunctiva/corneas clear, EOM's intact both eyes  Ears:    Normal TM's and external ear canals, both ears  Nose:   Nares normal, septum midline, mucosa normal, no  drainage    or sinus tenderness  Throat:   Lips, mucosa, and tongue normal; teeth and gums normal  Neck:   Supple, symmetrical, trachea midline, no adenopathy;    Thyroid: no enlargement/tenderness/nodules  Back:     Symmetric, no curvature, ROM normal, no CVA tenderness  Lungs:     Clear to auscultation bilaterally, respirations unlabored  Chest Wall:    No tenderness or deformity   Heart:    Regular rate and rhythm, S1 and S2 normal, no murmur, rub   or gallop  Breast Exam:    Deferred to GYN  Abdomen:     Soft, non-tender, bowel sounds active all four quadrants,    no masses, no organomegaly  Genitalia:    Deferred to GYN  Rectal:    Extremities:   Extremities normal, atraumatic, no cyanosis or edema  Pulses:   2+ and symmetric all extremities  Skin:   Skin color, texture, turgor normal, no rashes or lesions  Lymph nodes:   Cervical, supraclavicular, and axillary nodes normal  Neurologic:   CNII-XII intact, normal strength, sensation and reflexes    throughout          Assessment & Plan:

## 2022-07-15 NOTE — Assessment & Plan Note (Signed)
Pt's PE WNL.  UTD on mammo, cologuard, PNA, flu.  Check labs.  Anticipatory guidance provided.

## 2022-07-16 ENCOUNTER — Other Ambulatory Visit: Payer: Self-pay

## 2022-07-16 ENCOUNTER — Telehealth: Payer: Self-pay

## 2022-07-16 DIAGNOSIS — E559 Vitamin D deficiency, unspecified: Secondary | ICD-10-CM

## 2022-07-16 MED ORDER — VITAMIN D (ERGOCALCIFEROL) 1.25 MG (50000 UNIT) PO CAPS: 50000.0000 [IU] | ORAL_CAPSULE | ORAL | 12 refills | Status: DC

## 2022-07-16 NOTE — Telephone Encounter (Signed)
Pt seen results Via my chart Vit d 50,000 units has been sent to pharmacy

## 2022-07-16 NOTE — Telephone Encounter (Signed)
-----   Message from Midge Minium, MD sent at 07/16/2022  7:10 AM EST ----- Labs look great w/ exception of mildly low Vit D.  Based on this, we need to start 50,000 units weekly x12 weeks in addition to daily OTC supplement of at least 2000 units.

## 2022-08-05 ENCOUNTER — Telehealth: Payer: Self-pay | Admitting: Family Medicine

## 2022-08-05 NOTE — Telephone Encounter (Signed)
Caller name: Rhondia Navalta  On DPR?: Yes  Call back number: [phone] (mobile)  Provider they see: Midge Minium, MD  Reason for call:Pt had RSV 2 weeks ago- still has wheezing but no fever. Advise

## 2022-08-05 NOTE — Telephone Encounter (Signed)
Scheduled pt w/ Dr Carlota Raspberry for tomorrow at 1140 am

## 2022-08-06 ENCOUNTER — Ambulatory Visit (INDEPENDENT_AMBULATORY_CARE_PROVIDER_SITE_OTHER): Payer: PPO | Admitting: Family Medicine

## 2022-08-06 ENCOUNTER — Encounter: Payer: Self-pay | Admitting: Family Medicine

## 2022-08-06 VITALS — BP 158/102 | HR 75 | Temp 98.2°F | Ht 65.0 in | Wt 134.8 lb

## 2022-08-06 DIAGNOSIS — R062 Wheezing: Secondary | ICD-10-CM

## 2022-08-06 DIAGNOSIS — R052 Subacute cough: Secondary | ICD-10-CM

## 2022-08-06 DIAGNOSIS — J22 Unspecified acute lower respiratory infection: Secondary | ICD-10-CM | POA: Diagnosis not present

## 2022-08-06 MED ORDER — ALBUTEROL SULFATE HFA 108 (90 BASE) MCG/ACT IN AERS: 2.0000 | INHALATION_SPRAY | Freq: Four times a day (QID) | RESPIRATORY_TRACT | 0 refills | Status: DC | PRN

## 2022-08-06 MED ORDER — PREDNISONE 20 MG PO TABS: 40.0000 mg | ORAL_TABLET | Freq: Every day | ORAL | 0 refills | Status: DC

## 2022-08-06 MED ORDER — AZITHROMYCIN 250 MG PO TABS: ORAL_TABLET | ORAL | 0 refills | Status: AC

## 2022-08-06 NOTE — Patient Instructions (Addendum)
Start antibiotic azithromycin as well as prednisone that should help with wheeze.  Albuterol every 6 hours as needed for wheezing.  Mucinex is okay over-the-counter if needed for cough, but stop if that increases wheeze.  Expect symptoms to be improving the next few days, but if you have not significantly improved through the weekend, please have x-ray performed at [facility] location below on Monday.  Monitor your blood pressures and if those remain elevated once coughing improves, please return for recheck.  Feel better soon!  Return to the clinic or go to the nearest emergency room if any of your symptoms worsen or new symptoms occur.  [facility] or xray: Walk in 8:30-4:30 during weekdays, no appointment needed [address]  [place], [place] [address]  Cough, Adult Coughing is a reflex that clears your throat and airways (respiratory system). It helps heal and protect your lungs. It is normal to cough from time to time. A cough that happens with other symptoms or that lasts a long time may be a sign of a condition that needs treatment. A short-term (acute) cough may only last 2-3 weeks. A long-term (chronic) cough may last 8 or more weeks. Coughing is often caused by: Diseases, such as: An infection of the respiratory system. Asthma or other heart or lung diseases. Gastroesophageal reflux. This is when acid comes back up from the stomach. Breathing in things that irritate your lungs. Allergies. Postnasal drip. This is when mucus runs down the back of your throat. Smoking. Some medicines. Follow these instructions at home: Medicines Take over-the-counter and prescription medicines only as told by your health care provider. Talk with your provider before you take cough medicine (cough suppressants). Eating and drinking Do not drink alcohol. Avoid caffeine. Drink enough fluid to keep your pee (urine) pale yellow. Lifestyle Avoid cigarette smoke. Do not use any products that contain  nicotine or tobacco. These products include cigarettes, chewing tobacco, and vaping devices, such as e-cigarettes. If you need help quitting, ask your provider. Avoid things that make you cough. These may include perfumes, candles, cleaning products, or campfire smoke. General instructions  Watch for any changes to your cough. Tell your provider about them. Always cover your mouth when you cough. If the air is dry in your bedroom or home, use a cool mist vaporizer or humidifier. If your cough is worse at night, try to sleep in a semi-upright position. Rest as needed. Contact a health care provider if: You have new symptoms, or your symptoms get worse. You cough up pus. You have a fever that does not go away or a cough that does not get better after 2-3 weeks. You cannot control your cough with medicine, and you are losing sleep. You have pain that gets worse or is not helped with medicine. You lose weight for no clear reason. You have night sweats. Get help right away if: You cough up blood. You have trouble breathing. Your heart is beating very fast. These symptoms may be an emergency. Get help right away. Call 911. Do not wait to see if the symptoms will go away. Do not drive yourself to the hospital. This information is not intended to replace advice given to you by your health care provider. Make sure you discuss any questions you have with your health care provider. Document Revised: 12/27/2021 Document Reviewed: 12/27/2021 Elsevier Patient Education  [employer].   Bronchospasm, Adult  Bronchospasm is a tightening of the smooth muscle that wraps around the small  airways in the lungs. When the muscle tightens, the small airways narrow. Narrowed airways limit the air you breathe in or out of your lungs. Inflammation (swelling) and more mucus (sputum) than usual can further irritate the airways. This can make it very hard to breathe. Bronchospasm can happen suddenly or over a  period of time. What are the causes? Common causes of this condition include: An infection, such as a cold or sinus drainage. Exercise. Strong odors from aerosol sprays, and fumes from perfume, candles, and household cleaners. Cold air. Stress or strong emotions such as crying or laughing. What increases the risk? The following factors may make you more likely to develop this condition: Having asthma. Smoking or being around someone who smokes (secondhand smoke). Seasonal allergies, such as pollen or mold. Allergic reaction (anaphylaxis) to food, medicine, or insect bites or stings. What are the signs or symptoms? Symptoms of this condition include: Making a high-pitched whistling sound when you breathe, most often when you breathe out (wheezing). Coughing. Chest tightness. Shortness of breath. Decreased ability to exercise. Noisy breathing or a high-pitched cough. How is this diagnosed? This condition may be diagnosed based on your medical history and a physical exam. Your health care provider may also perform tests, including: A chest X-ray. Lung function tests. How is this treated? This condition may be treated by: Using inhaled medicines. These open up (relax) the airways and help you breathe. They can be taken with a metered dose inhaler or a nebulizer device. Taking corticosteroid medicines. These may be given to reduce inflammation and swelling. Removing the irritant or trigger that started the bronchospasm. Follow these instructions at home: Medicines Take over-the-counter and prescription medicines only as told by your health care provider. If you need to use an inhaler or nebulizer to take your medicine, ask your health care provider how to use it correctly. You may be given a spacer to use with your inhaler. This makes it easier to get the medicine from the inhaler into your lungs. Lifestyle Do not use any products that contain nicotine or tobacco. These products  include cigarettes, chewing tobacco, and vaping devices, such as e-cigarettes. If you need help quitting, ask your health care provider. Keep track of things that trigger your bronchospasm. Avoid these if possible. When pollen, air pollution, or humidity levels are bad, keep windows closed and use an air conditioner or go to places that have air conditioning. Find ways to manage stress and your emotions, such as mindfulness, relaxation, or breathing exercises. Activity Some people have bronchospasm when they exercise. This is called exercise-induced bronchoconstriction (EIB). If you have this problem, talk with your health care provider about how to manage EIB. Some tips include: Using your fast-acting inhaler before exercise. Exercising indoors if it is very cold or humid, or if the pollen and mold counts are high. Warming up and cool down before and after exercise. Stopping exercising right away if your symptoms start or get worse. General instructions If you have asthma, make sure you have an asthma action plan. Stay up to date on your immunizations. Keep all follow-up visits. This is important. Get help right away if: You have trouble breathing. Your wheezing and coughing do not get better after taking your medicine. You have chest pain. You have trouble speaking more than one-word sentences. These symptoms may be an emergency. Get help right away. Call 911. Do not wait to see if the symptoms will go away. Do not drive yourself to the hospital. Summary  Bronchospasm is a tightening of the smooth muscle that wraps around the small airways in the lungs. Some people have bronchospasm when they exercise. This is called exercise-induced bronchoconstriction (EIB). If you have this problem, talk with your health care provider about how to manage EIB. Do not use any products that contain nicotine or tobacco. These products include cigarettes, chewing tobacco, and vaping devices, such as  e-cigarettes. If you need help quitting, ask your health care provider. Get help right away if your wheezing and coughing do not get better after taking your medicine. This information is not intended to replace advice given to you by your health care provider. Make sure you discuss any questions you have with your health care provider. Document Revised: 11/19/2020 Document Reviewed: 11/19/2020 Elsevier Patient Education  [employer].

## 2022-08-06 NOTE — Progress Notes (Signed)
Subjective:  Patient ID: Dawn Franco, female    DOB: 03-27-1951  Age: 72 y.o. MRN: [id number]  CC:  Chief Complaint  Patient presents with   Wheezing    Pt states that she thinks she had symptoms of RSV and still has lingering cough with green mucus. Also wheezing when she breaths.     HPI Dawn Franco presents for   Cough, wheezing: Started 2 weeks ago - low grade fever, cough, congestion, body ache. Thought to be RSV. Negative covid testing.   Sick contact in [place] prior to her illness. Tx dayquil, nyquil.  Covid and flu vaccine in 01/2022. Did not have RSV vaccine.  No recent fever. Persistent cough since that time. Yellow-green phlegm, no dyspnea/chest pain.   Less exercise recenlty d/t illness.  Some wheeze lower with cough or congestion.    History Patient Active Problem List   Diagnosis Date Noted   Hemangioma 07/12/2021   Personal history of malignant melanoma of skin 07/12/2021   Lentigo 07/12/2021   Melanocytic nevus of face 07/12/2021   Other seborrheic keratosis 07/12/2021   Hyperlipidemia 06/23/2017   Physical exam 06/19/2016   Vitamin D deficiency 06/19/2016   Heart murmur 01/23/2015   Osteoporosis 01/23/2015   Past Medical History:  Diagnosis Date   Basal cell carcinoma    forehead   Chicken pox    Heart murmur    Mitral Valve   Melanoma ([place])    left arm    Past Surgical History:  Procedure Laterality Date   local kin cancer removal     MOHS SURGERY     TONSILLECTOMY AND ADENOIDECTOMY  1957   No Known Allergies Prior to Admission medications   Medication Sig Start Date End Date Taking? Authorizing Provider  Vitamin D, Ergocalciferol, (DRISDOL) 1.25 MG (50000 UNIT) CAPS capsule Take 1 capsule (50,000 Units total) by mouth every 7 (seven) days. 07/16/22  Yes Midge Minium, MD   Social History   Socioeconomic History   Marital status: Married    Spouse name: Not on file   Number of children: Not on file   Years of  education: Not on file   Highest education level: Not on file  Occupational History   Not on file  Tobacco Use   Smoking status: Never   Smokeless tobacco: Never  Vaping Use   Vaping Use: Never used  Substance and Sexual Activity   Alcohol use: Yes    Alcohol/week: 0.0 standard drinks of alcohol    Comment: Wine with meals   Drug use: No   Sexual activity: Not on file  Other Topics Concern   Not on file  Social History Narrative   She does not work    Married 37 years   Two children both are local ( Son 54, Daughter 20)   Social Determinants of Radio broadcast assistant Strain: Not on file  Food Insecurity: Not on file  Transportation Needs: Not on file  Physical Activity: Not on file  Stress: Not on file  Social Connections: Not on file  Intimate Partner Violence: Not on file    Review of Systems   Objective:   Vitals:   08/06/22 1144 08/06/22 1156  BP: (!) 160/98 (!) 158/102  Pulse: 75   Temp: 98.2 F (36.8 C)   TempSrc: Temporal   SpO2: 96%   Weight: 134 lb 12.8 oz (61.1 kg)   Height: 5\' 5"  (1.651 m)    BP Readings from Last  3 Encounters:  08/06/22 (!) 158/102  07/15/22 124/76  04/17/22 134/80     Physical Exam Vitals reviewed.  Constitutional:      General: She is not in acute distress.    Appearance: She is well-developed. She is not ill-appearing, toxic-appearing or diaphoretic.  HENT:     Head: Normocephalic and atraumatic.     Right Ear: Hearing, tympanic membrane, ear canal and external ear normal.     Left Ear: Hearing, tympanic membrane, ear canal and external ear normal.     Nose: Nose normal.     Mouth/Throat:     Pharynx: No posterior oropharyngeal erythema.  Eyes:     Conjunctiva/sclera: Conjunctivae normal.     Pupils: Pupils are equal, round, and reactive to light.  Cardiovascular:     Rate and Rhythm: Normal rate and regular rhythm.     Heart sounds: Normal heart sounds. No murmur heard. Pulmonary:     Effort: Pulmonary  effort is normal. No respiratory distress.     Breath sounds: Wheezing (Scattered diffuse wheeze, normal respiratory effort, few faint rhonchi possible but primarily wheeze.  Speaking in full sentences without distress.) present.  Skin:    General: Skin is warm and dry.     Findings: No rash.  Neurological:     Mental Status: She is alert and oriented to person, place, and time.  Psychiatric:        Mood and Affect: Mood normal.        Behavior: Behavior normal.        Assessment & Plan:  Dawn Franco is a 72 y.o. female . Subacute cough - Plan: DG Chest 2 View  LRTI (lower respiratory tract infection) - Plan: azithromycin (ZITHROMAX) 250 MG tablet  Wheezing - Plan: predniSONE (DELTASONE) 20 MG tablet, albuterol (VENTOLIN HFA) 108 (90 Base) MCG/ACT inhaler  Likely initial viral illness with persistent cough, now lower respiratory infection.  Afebrile, O2 sat stable.  Nontoxic appearance.  Secondary bronchospasm/wheezing.  No known history of asthma.  Differential includes viral illness, or atypicals like mycoplasma.  -Start azithromycin, prednisone for wheeze with potential side effects discussed.  Albuterol if needed up to every 6 hours but that should lessen once prednisone starts to work.  Mucinex over-the-counter if needed for cough, stop if increased wheeze.  Chest x-ray ordered if not improving in next few days with ER/RTC precautions given.  Meds ordered this encounter  Medications   predniSONE (DELTASONE) 20 MG tablet    Sig: Take 2 tablets (40 mg total) by mouth daily with breakfast.    Dispense:  10 tablet    Refill:  0   azithromycin (ZITHROMAX) 250 MG tablet    Sig: Take 2 tablets on day 1, then 1 tablet daily on days 2 through 5    Dispense:  6 tablet    Refill:  0   albuterol (VENTOLIN HFA) 108 (90 Base) MCG/ACT inhaler    Sig: Inhale 2 puffs into the lungs every 6 (six) hours as needed for wheezing or shortness of breath.    Dispense:  8 g    Refill:  0    Patient Instructions  Start antibiotic azithromycin as well as prednisone that should help with wheeze.  Albuterol every 6 hours as needed for wheezing.  Mucinex is okay over-the-counter if needed for cough, but stop if that increases wheeze.  Expect symptoms to be improving the next few days, but if you have not significantly improved through the weekend, please have  x-ray performed at [facility] location below on Monday.  Monitor your blood pressures and if those remain elevated once coughing improves, please return for recheck.  Feel better soon!  Return to the clinic or go to the nearest emergency room if any of your symptoms worsen or new symptoms occur.  [facility] or xray: Walk in 8:30-4:30 during weekdays, no appointment needed [place].  [place], [place] [address]  Cough, Adult Coughing is a reflex that clears your throat and airways (respiratory system). It helps heal and protect your lungs. It is normal to cough from time to time. A cough that happens with other symptoms or that lasts a long time may be a sign of a condition that needs treatment. A short-term (acute) cough may only last 2-3 weeks. A long-term (chronic) cough may last 8 or more weeks. Coughing is often caused by: Diseases, such as: An infection of the respiratory system. Asthma or other heart or lung diseases. Gastroesophageal reflux. This is when acid comes back up from the stomach. Breathing in things that irritate your lungs. Allergies. Postnasal drip. This is when mucus runs down the back of your throat. Smoking. Some medicines. Follow these instructions at home: Medicines Take over-the-counter and prescription medicines only as told by your health care provider. Talk with your provider before you take cough medicine (cough suppressants). Eating and drinking Do not drink alcohol. Avoid caffeine. Drink enough fluid to keep your pee (urine) pale yellow. Lifestyle Avoid cigarette smoke. Do not use any  products that contain nicotine or tobacco. These products include cigarettes, chewing tobacco, and vaping devices, such as e-cigarettes. If you need help quitting, ask your provider. Avoid things that make you cough. These may include perfumes, candles, cleaning products, or campfire smoke. General instructions  Watch for any changes to your cough. Tell your provider about them. Always cover your mouth when you cough. If the air is dry in your bedroom or home, use a cool mist vaporizer or humidifier. If your cough is worse at night, try to sleep in a semi-upright position. Rest as needed. Contact a health care provider if: You have new symptoms, or your symptoms get worse. You cough up pus. You have a fever that does not go away or a cough that does not get better after 2-3 weeks. You cannot control your cough with medicine, and you are losing sleep. You have pain that gets worse or is not helped with medicine. You lose weight for no clear reason. You have night sweats. Get help right away if: You cough up blood. You have trouble breathing. Your heart is beating very fast. These symptoms may be an emergency. Get help right away. Call 911. Do not wait to see if the symptoms will go away. Do not drive yourself to the hospital. This information is not intended to replace advice given to you by your health care provider. Make sure you discuss any questions you have with your health care provider. Document Revised: 12/27/2021 Document Reviewed: 12/27/2021 Elsevier Patient Education  [place].   Bronchospasm, Adult  Bronchospasm is a tightening of the smooth muscle that wraps around the small airways in the lungs. When the muscle tightens, the small airways narrow. Narrowed airways limit the air you breathe in or out of your lungs. Inflammation (swelling) and more mucus (sputum) than usual can further irritate the airways. This can make it very hard to breathe. Bronchospasm can  happen suddenly or over a period of time. What  are the causes? Common causes of this condition include: An infection, such as a cold or sinus drainage. Exercise. Strong odors from aerosol sprays, and fumes from perfume, candles, and household cleaners. Cold air. Stress or strong emotions such as crying or laughing. What increases the risk? The following factors may make you more likely to develop this condition: Having asthma. Smoking or being around someone who smokes (secondhand smoke). Seasonal allergies, such as pollen or mold. Allergic reaction (anaphylaxis) to food, medicine, or insect bites or stings. What are the signs or symptoms? Symptoms of this condition include: Making a high-pitched whistling sound when you breathe, most often when you breathe out (wheezing). Coughing. Chest tightness. Shortness of breath. Decreased ability to exercise. Noisy breathing or a high-pitched cough. How is this diagnosed? This condition may be diagnosed based on your medical history and a physical exam. Your health care provider may also perform tests, including: A chest X-ray. Lung function tests. How is this treated? This condition may be treated by: Using inhaled medicines. These open up (relax) the airways and help you breathe. They can be taken with a metered dose inhaler or a nebulizer device. Taking corticosteroid medicines. These may be given to reduce inflammation and swelling. Removing the irritant or trigger that started the bronchospasm. Follow these instructions at home: Medicines Take over-the-counter and prescription medicines only as told by your health care provider. If you need to use an inhaler or nebulizer to take your medicine, ask your health care provider how to use it correctly. You may be given a spacer to use with your inhaler. This makes it easier to get the medicine from the inhaler into your lungs. Lifestyle Do not use any products that contain nicotine or  tobacco. These products include cigarettes, chewing tobacco, and vaping devices, such as e-cigarettes. If you need help quitting, ask your health care provider. Keep track of things that trigger your bronchospasm. Avoid these if possible. When pollen, air pollution, or humidity levels are bad, keep windows closed and use an air conditioner or go to places that have air conditioning. Find ways to manage stress and your emotions, such as mindfulness, relaxation, or breathing exercises. Activity Some people have bronchospasm when they exercise. This is called exercise-induced bronchoconstriction (EIB). If you have this problem, talk with your health care provider about how to manage EIB. Some tips include: Using your fast-acting inhaler before exercise. Exercising indoors if it is very cold or humid, or if the pollen and mold counts are high. Warming up and cool down before and after exercise. Stopping exercising right away if your symptoms start or get worse. General instructions If you have asthma, make sure you have an asthma action plan. Stay up to date on your immunizations. Keep all follow-up visits. This is important. Get help right away if: You have trouble breathing. Your wheezing and coughing do not get better after taking your medicine. You have chest pain. You have trouble speaking more than one-word sentences. These symptoms may be an emergency. Get help right away. Call 911. Do not wait to see if the symptoms will go away. Do not drive yourself to the hospital. Summary Bronchospasm is a tightening of the smooth muscle that wraps around the small airways in the lungs. Some people have bronchospasm when they exercise. This is called exercise-induced bronchoconstriction (EIB). If you have this problem, talk with your health care provider about how to manage EIB. Do not use any products that contain nicotine or tobacco. These products  include cigarettes, chewing tobacco, and vaping  devices, such as e-cigarettes. If you need help quitting, ask your health care provider. Get help right away if your wheezing and coughing do not get better after taking your medicine. This information is not intended to replace advice given to you by your health care provider. Make sure you discuss any questions you have with your health care provider. Document Revised: 11/19/2020 Document Reviewed: 11/19/2020 Elsevier Patient Education  [place],   Merri Ray, MD [place] 08/06/22 1:22 PM

## 2022-08-12 ENCOUNTER — Other Ambulatory Visit: Payer: Self-pay | Admitting: Family Medicine

## 2022-08-12 DIAGNOSIS — I341 Nonrheumatic mitral (valve) prolapse: Secondary | ICD-10-CM

## 2022-08-12 NOTE — Telephone Encounter (Signed)
Encourage patient to contact the pharmacy for refills or they can request refills through [place]:  [facility] [address] [place] [address]  MEDICATION NAME & DOSE: Amoxicillin 500mg    NOTES/COMMENTS FROM PATIENT:  Pt actually dropped off empty bottle for refill.      [place] office please notify patient: It takes 48-72 hours to process rx refill requests Ask patient to call pharmacy to ensure rx is ready before heading there.

## 2022-08-13 NOTE — Telephone Encounter (Signed)
Left message for pt to call back for an appt.

## 2022-08-13 NOTE — Telephone Encounter (Signed)
Pt will need an appointment if they are in need or antibiotics

## 2022-08-14 DIAGNOSIS — Z1231 Encounter for screening mammogram for malignant neoplasm of breast: Secondary | ICD-10-CM | POA: Diagnosis not present

## 2022-08-14 LAB — HM MAMMOGRAPHY

## 2022-08-14 MED ORDER — AMOXICILLIN 500 MG PO CAPS: ORAL_CAPSULE | ORAL | 0 refills | Status: DC

## 2022-08-14 NOTE — Telephone Encounter (Signed)
Patient called back stating that she has mitral valve prolapse. Patient needs a antibiotic before for dental appt is 08/19/22. Dr.Tabori next open appt is 08/25/22.

## 2022-08-14 NOTE — Telephone Encounter (Signed)
Pt states she needs abx filled prior to dental procedure due to replaced joint.

## 2022-08-15 NOTE — Telephone Encounter (Signed)
Pt has been advised and did apologize for the confusion placed a note on her file but did advise when she calls in she should let the front desk staff know the reason to be sure she gets this faster in the future

## 2022-08-28 ENCOUNTER — Telehealth: Payer: Self-pay | Admitting: Family Medicine

## 2022-08-28 NOTE — Telephone Encounter (Signed)
Contacted Dawn Franco to schedule their annual wellness visit. Call back at later date: MAY,2024  Patient's in [place].  Thank you,  [facility] Support [facility] Direct dial  [phone]

## 2022-09-10 ENCOUNTER — Telehealth: Payer: Self-pay | Admitting: Family Medicine

## 2022-09-10 NOTE — Telephone Encounter (Signed)
Called patient to schedule Medicare Annual Wellness Visit (AWV). No voicemail available to leave a message.  Last date of AWV: 07/07/2019   Please schedule an AWVS appointment at any time with [facility] ANNUAL WELLNESS VISIT.  If any questions, please contact me at [phone].    Thank you,  [facility] Support [facility] Direct dial  [phone]

## 2022-09-23 ENCOUNTER — Telehealth: Payer: Self-pay | Admitting: Family Medicine

## 2022-09-23 NOTE — Telephone Encounter (Signed)
Contacted Dawn Franco to schedule their annual wellness visit. Appointment made for 09/25/2022.  Thank you,  [facility] Support [facility] Direct dial  [phone]

## 2022-09-25 ENCOUNTER — Ambulatory Visit (INDEPENDENT_AMBULATORY_CARE_PROVIDER_SITE_OTHER): Payer: PPO | Admitting: *Deleted

## 2022-09-25 DIAGNOSIS — Z0000 Encounter for general adult medical examination without abnormal findings: Secondary | ICD-10-CM

## 2022-09-25 NOTE — Patient Instructions (Signed)
Dawn Franco , Thank you for taking time to come for your Medicare Wellness Visit. I appreciate your ongoing commitment to your health goals. Please review the following plan we discussed and let me know if I can assist you in the future.   Screening recommendations/referrals: Colonoscopy:  Cologuard  discuss with MD at next visit Mammogram: up to date Bone Density: up to date Recommended yearly ophthalmology/optometry visit for glaucoma screening and checkup Recommended yearly dental visit for hygiene and checkup  Vaccinations: Influenza vaccine: up to date Pneumococcal vaccine: up to date Tdap vaccine: Education provided Shingles vaccine: 1 of  2    Advanced directives: yes not on file     Preventive Care 65 Years and Older, Female Preventive care refers to lifestyle choices and visits with your health care provider that can promote health and wellness. What does preventive care include? A yearly physical exam. This is also called an annual well check. Dental exams once or twice a year. Routine eye exams. Ask your health care provider how often you should have your eyes checked. Personal lifestyle choices, including: Daily care of your teeth and gums. Regular physical activity. Eating a healthy diet. Avoiding tobacco and drug use. Limiting alcohol use. Practicing safe sex. Taking low-dose aspirin every day. Taking vitamin and mineral supplements as recommended by your health care provider. What happens during an annual well check? The services and screenings done by your health care provider during your annual well check will depend on your age, overall health, lifestyle risk factors, and family history of disease. Counseling  Your health care provider may ask you questions about your: Alcohol use. Tobacco use. Drug use. Emotional well-being. Home and relationship well-being. Sexual activity. Eating habits. History of falls. Memory and ability to understand  (cognition). Work and work Astronomer. Reproductive health. Screening  You may have the following tests or measurements: Height, weight, and BMI. Blood pressure. Lipid and cholesterol levels. These may be checked every 5 years, or more frequently if you are over 65 years old. Skin check. Lung cancer screening. You may have this screening every year starting at age 49 if you have a 30-pack-year history of smoking and currently smoke or have quit within the past 15 years. Fecal occult blood test (FOBT) of the stool. You may have this test every year starting at age 43. Flexible sigmoidoscopy or colonoscopy. You may have a sigmoidoscopy every 5 years or a colonoscopy every 10 years starting at age 92. Hepatitis C blood test. Hepatitis B blood test. Sexually transmitted disease (STD) testing. Diabetes screening. This is done by checking your blood sugar (glucose) after you have not eaten for a while (fasting). You may have this done every 1-3 years. Bone density scan. This is done to screen for osteoporosis. You may have this done starting at age 7. Mammogram. This may be done every 1-2 years. Talk to your health care provider about how often you should have regular mammograms. Talk with your health care provider about your test results, treatment options, and if necessary, the need for more tests. Vaccines  Your health care provider may recommend certain vaccines, such as: Influenza vaccine. This is recommended every year. Tetanus, diphtheria, and acellular pertussis (Tdap, Td) vaccine. You may need a Td booster every 10 years. Zoster vaccine. You may need this after age 44. Pneumococcal 13-valent conjugate (PCV13) vaccine. One dose is recommended after age 95. Pneumococcal polysaccharide (PPSV23) vaccine. One dose is recommended after age 11. Talk to your health care provider  about which screenings and vaccines you need and how often you need them. This information is not intended to  replace advice given to you by your health care provider. Make sure you discuss any questions you have with your health care provider. Document Released: 05/25/2015 Document Revised: 01/16/2016 Document Reviewed: 02/27/2015 Elsevier Interactive Patient Education  2017 [employer].  Fall Prevention in the Home Falls can cause injuries. They can happen to people of all ages. There are many things you can do to make your home safe and to help prevent falls. What can I do on the outside of my home? Regularly fix the edges of walkways and driveways and fix any cracks. Remove anything that might make you trip as you walk through a door, such as a raised step or threshold. Trim any bushes or trees on the path to your home. Use bright outdoor lighting. Clear any walking paths of anything that might make someone trip, such as rocks or tools. Regularly check to see if handrails are loose or broken. Make sure that both sides of any steps have handrails. Any raised decks and porches should have guardrails on the edges. Have any leaves, snow, or ice cleared regularly. Use sand or salt on walking paths during winter. Clean up any spills in your garage right away. This includes oil or grease spills. What can I do in the bathroom? Use night lights. Install grab bars by the toilet and in the tub and shower. Do not use towel bars as grab bars. Use non-skid mats or decals in the tub or shower. If you need to sit down in the shower, use a plastic, non-slip stool. Keep the floor dry. Clean up any water that spills on the floor as soon as it happens. Remove soap buildup in the tub or shower regularly. Attach bath mats securely with double-sided non-slip rug tape. Do not have throw rugs and other things on the floor that can make you trip. What can I do in the bedroom? Use night lights. Make sure that you have a light by your bed that is easy to reach. Do not use any sheets or blankets that are too big for  your bed. They should not hang down onto the floor. Have a firm chair that has side arms. You can use this for support while you get dressed. Do not have throw rugs and other things on the floor that can make you trip. What can I do in the kitchen? Clean up any spills right away. Avoid walking on wet floors. Keep items that you use a lot in easy-to-reach places. If you need to reach something above you, use a strong step stool that has a grab bar. Keep electrical cords out of the way. Do not use floor polish or wax that makes floors slippery. If you must use wax, use non-skid floor wax. Do not have throw rugs and other things on the floor that can make you trip. What can I do with my stairs? Do not leave any items on the stairs. Make sure that there are handrails on both sides of the stairs and use them. Fix handrails that are broken or loose. Make sure that handrails are as long as the stairways. Check any carpeting to make sure that it is firmly attached to the stairs. Fix any carpet that is loose or worn. Avoid having throw rugs at the top or bottom of the stairs. If you do have throw rugs, attach them to the floor  with carpet tape. Make sure that you have a light switch at the top of the stairs and the bottom of the stairs. If you do not have them, ask someone to add them for you. What else can I do to help prevent falls? Wear shoes that: Do not have high heels. Have rubber bottoms. Are comfortable and fit you well. Are closed at the toe. Do not wear sandals. If you use a stepladder: Make sure that it is fully opened. Do not climb a closed stepladder. Make sure that both sides of the stepladder are locked into place. Ask someone to hold it for you, if possible. Clearly mark and make sure that you can see: Any grab bars or handrails. First and last steps. Where the edge of each step is. Use tools that help you move around (mobility aids) if they are needed. These  include: Canes. Walkers. Scooters. Crutches. Turn on the lights when you go into a dark area. Replace any light bulbs as soon as they burn out. Set up your furniture so you have a clear path. Avoid moving your furniture around. If any of your floors are uneven, fix them. If there are any pets around you, be aware of where they are. Review your medicines with your doctor. Some medicines can make you feel dizzy. This can increase your chance of falling. Ask your doctor what other things that you can do to help prevent falls. This information is not intended to replace advice given to you by your health care provider. Make sure you discuss any questions you have with your health care provider. Document Released: 02/22/2009 Document Revised: 10/04/2015 Document Reviewed: 06/02/2014 Elsevier Interactive Patient Education  2017 [employer].

## 2022-09-25 NOTE — Progress Notes (Signed)
Subjective:   Dawn Franco is a 72 y.o. female who presents for Medicare Annual (Subsequent) preventive examination.  I connected with  Dawn Franco on 09/25/22 by a telephone enabled telemedicine application and verified that I am speaking with the correct person using two identifiers.   I discussed the limitations of evaluation and management by telemedicine. The patient expressed understanding and agreed to proceed.  Patient location: home  Provider location: telephone/home    Review of Systems     Cardiac Risk Factors include: advanced age (>71men, >46 women)     Objective:    Today's Vitals   There is no height or weight on file to calculate BMI.     09/25/2022    3:58 PM 06/30/2018   11:19 AM 06/23/2017    9:32 AM  Advanced Directives  Does Patient Have a Medical Advance Directive? Yes Yes Yes  Type of Advance Directive Living will;Healthcare Power of [employer] Power of [place];Living will Living will;Healthcare Power of Attorney  Copy of Healthcare Power of Attorney in Chart? No - copy requested Yes - validated most recent copy scanned in chart (See row information) No - copy requested    Current Medications (verified) Outpatient Encounter Medications as of 09/25/2022  Medication Sig   albuterol (VENTOLIN HFA) 108 (90 Base) MCG/ACT inhaler Inhale 2 puffs into the lungs every 6 (six) hours as needed for wheezing or shortness of breath.   Vitamin D, Ergocalciferol, (DRISDOL) 1.25 MG (50000 UNIT) CAPS capsule Take 1 capsule (50,000 Units total) by mouth every 7 (seven) days.   amoxicillin (AMOXIL) 500 MG capsule Take 4 tabs 1 hour prior to dental procedure. (Patient not taking: Reported on 09/25/2022)   predniSONE (DELTASONE) 20 MG tablet Take 2 tablets (40 mg total) by mouth daily with breakfast.   No facility-administered encounter medications on file as of 09/25/2022.    Allergies (verified) Patient has no known allergies.    History: Past Medical History:  Diagnosis Date   Basal cell carcinoma    forehead   Chicken pox    Heart murmur    Mitral Valve   Melanoma (HCC)    left arm    Past Surgical History:  Procedure Laterality Date   local kin cancer removal     MOHS SURGERY     TONSILLECTOMY AND ADENOIDECTOMY  1957   Family History  Problem Relation Age of Onset   Hypertension Mother    Social History   Socioeconomic History   Marital status: Married    Spouse name: Not on file   Number of children: Not on file   Years of education: Not on file   Highest education level: Not on file  Occupational History   Not on file  Tobacco Use   Smoking status: Never   Smokeless tobacco: Never  Vaping Use   Vaping Use: Never used  Substance and Sexual Activity   Alcohol use: Yes    Alcohol/week: 0.0 standard drinks of alcohol    Comment: Wine with meals   Drug use: No   Sexual activity: Yes  Other Topics Concern   Not on file  Social History Narrative   She does not work    Married 37 years   Two children both are local ( Son 13, Daughter 60)   Social Determinants of Health   Financial Resource Strain: Low Risk  (09/25/2022)   Overall Financial Resource Strain (CARDIA)    Difficulty of Paying Living Expenses: Not hard at  all  Food Insecurity: No Food Insecurity (09/25/2022)   Hunger Vital Sign    Worried About Running Out of Food in the Last Year: Never true    Ran Out of Food in the Last Year: Never true  Transportation Needs: No Transportation Needs (09/25/2022)   PRAPARE - Administrator, Civil Service (Medical): No    Lack of Transportation (Non-Medical): No  Physical Activity: Sufficiently Active (09/25/2022)   Exercise Vital Sign    Days of Exercise per Week: 4 days    Minutes of Exercise per Session: 50 min  Stress: No Stress Concern Present (09/25/2022)   [employer] of Occupational Health - Occupational Stress Questionnaire    Feeling of Stress : Not at all   Social Connections: Socially Integrated (09/25/2022)   Social Connection and Isolation Panel [NHANES]    Frequency of Communication with Friends and Family: More than three times a week    Frequency of Social Gatherings with Friends and Family: More than three times a week    Attends Religious Services: More than 4 times per year    Active Member of [employer] or Organizations: Yes    Attends Engineer, structural: More than 4 times per year    Marital Status: Married    Tobacco Counseling Counseling given: Not Answered   Clinical Intake:  Pre-visit preparation completed: Yes  Pain : No/denies pain     Diabetes: No     Diabetic?  Interpreter Needed?: No  Information entered by :: Remi Haggard LPN   Activities of Daily Living    09/25/2022    3:57 PM 07/15/2022    8:36 AM  In your present state of health, do you have any difficulty performing the following activities:  Hearing? 0 0  Vision? 0 0  Difficulty concentrating or making decisions? 0 0  Walking or climbing stairs? 0 0  Dressing or bathing? 0 0  Doing errands, shopping? 0 0  Preparing Food and eating ? N   Using the Toilet? N   In the past six months, have you accidently leaked urine? N   Do you have problems with loss of bowel control? N   Managing your Medications? N   Managing your Finances? N   Housekeeping or managing your Housekeeping? N     Patient Care Team: Sheliah Hatch, MD as PCP - General (Family Medicine) Antony Contras, MD as Consulting Physician (Ophthalmology) Sharyn Lull, Elvin So, MD as Referring Physician (Dermatology) Shea Evans, MD as Consulting Physician (Obstetrics and Gynecology)  Indicate any recent Medical Services you may have received from other than [facility] providers in the past year (date may be approximate).     Assessment:   This is a routine wellness examination for Dawn Franco.  Hearing/Vision screen Hearing Screening - Comments:: No hearing issues Vision  Screening - Comments:: Not up to date  Dietary issues and exercise activities discussed: Current Exercise Habits: Structured exercise class, Type of exercise: strength training/weights;stretching;walking, Time (Minutes): 50, Frequency (Times/Week): 4, Weekly Exercise (Minutes/Week): 200, Intensity: Moderate   Goals Addressed             This Visit's Progress    Increase physical activity         Depression Screen    09/25/2022    4:01 PM 07/15/2022    8:36 AM 04/17/2022    1:56 PM 09/04/2021   10:20 AM 07/12/2021    9:09 AM 07/10/2020    9:03 AM 07/07/2019    8:55  AM  PHQ 2/9 Scores  PHQ - 2 Score 0 0 0 0 0 0 0  PHQ- 9 Score 0 0 0 0 0 0 0    Fall Risk    09/25/2022    3:55 PM 07/15/2022    8:36 AM 04/17/2022    1:56 PM 09/04/2021   10:20 AM 07/12/2021    9:09 AM  Fall Risk   Falls in the past year? 0 0 0 0 0  Number falls in past yr: 0 0  0   Injury with Fall? 0 0  0   Risk for fall due to :  No Fall Risks No Fall Risks No Fall Risks No Fall Risks  Follow up Falls evaluation completed;Education provided;Falls prevention discussed Falls evaluation completed Falls evaluation completed Falls evaluation completed Falls evaluation completed    FALL RISK PREVENTION PERTAINING TO THE HOME:  Any stairs in or around the home? Yes  If so, are there any without handrails? No  Home free of loose throw rugs in walkways, pet beds, electrical cords, etc? Yes  Adequate lighting in your home to reduce risk of falls? Yes   ASSISTIVE DEVICES UTILIZED TO PREVENT FALLS:  Life alert? No  Use of a cane, walker or w/c? No  Grab bars in the bathroom? Yes  Shower chair or bench in shower? Yes  Elevated toilet seat or a handicapped toilet? Yes   TIMED UP AND GO:  Was the test performed? No .    Cognitive Function:    06/30/2018   11:20 AM  MMSE - Mini Mental State Exam  Orientation to time 5  Orientation to Place 5  Registration 3  Attention/ Calculation 5  Recall 3  Language- name 2  objects 2  Language- repeat 1  Language- follow 3 step command 3  Language- read & follow direction 1  Write a sentence 1  Copy design 1  Total score 30        09/25/2022    3:59 PM  6CIT Screen  What Year? 0 points  What month? 0 points  What time? 0 points  Count back from 20 0 points  Months in reverse 0 points  Repeat phrase 0 points  Total Score 0 points    Immunizations Immunization History  Administered Date(s) Administered   Fluad Quad(high Dose 65+) 02/08/2020   Influenza, High Dose Seasonal PF 06/07/2016, 03/10/2019   Influenza,inj,Quad PF,6+ Mos 03/12/2017   Influenza-Unspecified 02/17/2018, 02/08/2021, 02/06/2022   PFIZER(Purple Top)SARS-COV-2 Vaccination 05/23/2019, 06/13/2019, 02/08/2020, 02/06/2022   Pneumococcal Conjugate-13 06/19/2016   Pneumococcal Polysaccharide-23 06/23/2017   Td 05/12/2006   Tdap 05/12/2006   Tetanus 01/10/2001   Zoster Recombinat (Shingrix) 03/25/2018    TDAP status: Due, Education has been provided regarding the importance of this vaccine. Advised may receive this vaccine at local pharmacy or Health Dept. Aware to provide a copy of the vaccination record if obtained from local pharmacy or Health Dept. Verbalized acceptance and understanding.  Flu Vaccine status: Up to date  Pneumococcal vaccine status: Up to date  Covid-19 vaccine status: Information provided on how to obtain vaccines.   Qualifies for Shingles Vaccine? Yes   Zostavax completed No   Shingrix Completed?: No.    Education has been provided regarding the importance of this vaccine. Patient has been advised to call insurance company to determine out of pocket expense if they have not yet received this vaccine. Advised may also receive vaccine at local pharmacy or Health Dept. Verbalized acceptance and  understanding.  Screening Tests Health Maintenance  Topic Date Due   Fecal DNA (Cologuard)  08/08/2022   INFLUENZA VACCINE  12/11/2022   MAMMOGRAM  08/14/2023    Medicare Annual Wellness (AWV)  09/25/2023   Pneumonia Vaccine 38+ Years old  Completed   DEXA SCAN  Completed   Hepatitis C Screening  Completed   HPV VACCINES  Aged Out   DTaP/Tdap/Td  Discontinued   COVID-19 Vaccine  Discontinued   Zoster Vaccines- Shingrix  Discontinued    Health Maintenance  Health Maintenance Due  Topic Date Due   Fecal DNA (Cologuard)  08/08/2022    Colorectal cancer screening: Type of screening: Cologuard. Completed 2021. Repeat every 3 years  Mammogram status: Completed  . Repeat every year  Bone Density status: Completed 2018. Results reflect: Bone density results: OSTEOPENIA. Repeat every 2 years.  Lung Cancer Screening: (Low Dose CT Chest recommended if Age 71-80 years, 30 pack-year currently smoking OR have quit w/in 15years.) does not qualify.   Lung Cancer Screening Referral:   Additional Screening:  Hepatitis C Screening: does not qualify; Completed 2017  Vision Screening: Recommended annual ophthalmology exams for early detection of glaucoma and other disorders of the eye. Is the patient up to date with their annual eye exam?  No  Who is the provider or what is the name of the office in which the patient attends annual eye exams?  If pt is not established with a provider, would they like to be referred to a provider to establish care? No .   Dental Screening: Recommended annual dental exams for proper oral hygiene  Community Resource Referral / Chronic Care Management: CRR required this visit?  No   CCM required this visit?  No      Plan:     I have personally reviewed and noted the following in the patient's chart:   Medical and social history Use of alcohol, tobacco or illicit drugs  Current medications and supplements including opioid prescriptions. Patient is not currently taking opioid prescriptions. Functional ability and status Nutritional status Physical activity Advanced directives List of other  physicians Hospitalizations, surgeries, and ER visits in previous 12 months Vitals Screenings to include cognitive, depression, and falls Referrals and appointments  In addition, I have reviewed and discussed with patient certain preventive protocols, quality metrics, and best practice recommendations. A written personalized care plan for preventive services as well as general preventive health recommendations were provided to patient.     Remi Haggard, LPN   08/18/8117   Nurse Notes:

## 2023-01-27 ENCOUNTER — Encounter: Payer: Self-pay | Admitting: Family Medicine

## 2023-01-27 ENCOUNTER — Ambulatory Visit (INDEPENDENT_AMBULATORY_CARE_PROVIDER_SITE_OTHER): Payer: PPO | Admitting: Family Medicine

## 2023-01-27 VITALS — BP 146/88 | HR 74 | Temp 98.0°F | Wt 137.6 lb

## 2023-01-27 DIAGNOSIS — N898 Other specified noninflammatory disorders of vagina: Secondary | ICD-10-CM | POA: Diagnosis not present

## 2023-01-27 NOTE — Progress Notes (Signed)
Subjective:    Patient ID: Dawn Franco, female    DOB: [birth year], 72 y.o.   MRN: [id number]  HPI Low back pain- started 1 week ago.  This has 'largely gone away'.  Denies urinary sxs- no burning w/ urination, no frequency.  Thinks she has an irritated LN in R groin.  Has a pessary in place (for over a year) and wonders about possible infection.  Scant vaginal discharge.  Some odor to discharge.     Review of Systems For ROS see HPI     Objective:   Physical Exam Vitals reviewed.  Constitutional:      General: She is not in acute distress.    Appearance: Normal appearance. She is not ill-appearing.  Abdominal:     General: There is no distension.     Tenderness: There is no abdominal tenderness. There is no right CVA tenderness, left CVA tenderness, guarding or rebound.  Genitourinary:    Comments: Pt declined pelvic exam Lymphadenopathy:     Lower Body: No right inguinal adenopathy. No left inguinal adenopathy.  Skin:    General: Skin is warm and dry.  Neurological:     General: No focal deficit present.     Mental Status: She is alert and oriented to person, place, and time.  Psychiatric:        Mood and Affect: Mood normal.        Behavior: Behavior normal.        Thought Content: Thought content normal.           Assessment & Plan:  Vaginal d/c- new.  Pt denies urinary sxs and was unable to provide a sample today.  She reports vaginal d/c w/ odor and a general sense of feeling unwell.  Nothing specific, but 'i can tell when something is off'.  Back pain has resolved.  No palpable inguinal LN.  Pt declined pelvic exam in office.  Did Aptima self swab.  Encouraged her to schedule appt w/ GYN if sxs continue and results are non-diagnostic.  Pt expressed understanding and is in agreement w/ plan.

## 2023-01-27 NOTE — Patient Instructions (Signed)
Follow up as needed or as scheduled We'll notify you of your lab results and make any changes if needed Drink LOTS of water to flush through your kidneys Schedule an appt with GYN for evaluation of your pessary Call with any questions or concerns Hang in there!!!

## 2023-01-28 DIAGNOSIS — N898 Other specified noninflammatory disorders of vagina: Secondary | ICD-10-CM | POA: Diagnosis not present

## 2023-01-28 DIAGNOSIS — Z4689 Encounter for fitting and adjustment of other specified devices: Secondary | ICD-10-CM | POA: Diagnosis not present

## 2023-01-28 DIAGNOSIS — N760 Acute vaginitis: Secondary | ICD-10-CM | POA: Diagnosis not present

## 2023-01-28 DIAGNOSIS — N952 Postmenopausal atrophic vaginitis: Secondary | ICD-10-CM | POA: Diagnosis not present

## 2023-01-29 ENCOUNTER — Telehealth: Payer: Self-pay

## 2023-01-29 NOTE — Telephone Encounter (Signed)
-----   Message from Neena Rhymes sent at 01/29/2023  7:43 AM EDT ----- No evidence of vaginal infection.  Negative for yeast and BV.  This is great news

## 2023-01-30 LAB — NUSWAB VAGINITIS PLUS (VG+)
Candida albicans, NAA: NEGATIVE
Candida glabrata, NAA: NEGATIVE
Chlamydia trachomatis, NAA: NEGATIVE
Neisseria gonorrhoeae, NAA: NEGATIVE
Trich vag by NAA: NEGATIVE

## 2023-02-13 ENCOUNTER — Other Ambulatory Visit: Payer: Self-pay | Admitting: Family Medicine

## 2023-02-13 DIAGNOSIS — Z1211 Encounter for screening for malignant neoplasm of colon: Secondary | ICD-10-CM

## 2023-02-13 DIAGNOSIS — Z1212 Encounter for screening for malignant neoplasm of rectum: Secondary | ICD-10-CM

## 2023-02-22 DIAGNOSIS — Z1212 Encounter for screening for malignant neoplasm of rectum: Secondary | ICD-10-CM | POA: Diagnosis not present

## 2023-02-22 DIAGNOSIS — Z1211 Encounter for screening for malignant neoplasm of colon: Secondary | ICD-10-CM | POA: Diagnosis not present

## 2023-02-26 DIAGNOSIS — L814 Other melanin hyperpigmentation: Secondary | ICD-10-CM | POA: Diagnosis not present

## 2023-02-26 DIAGNOSIS — L821 Other seborrheic keratosis: Secondary | ICD-10-CM | POA: Diagnosis not present

## 2023-02-26 DIAGNOSIS — Z85820 Personal history of malignant melanoma of skin: Secondary | ICD-10-CM | POA: Diagnosis not present

## 2023-02-26 DIAGNOSIS — D225 Melanocytic nevi of trunk: Secondary | ICD-10-CM | POA: Diagnosis not present

## 2023-02-26 DIAGNOSIS — Z85828 Personal history of other malignant neoplasm of skin: Secondary | ICD-10-CM | POA: Diagnosis not present

## 2023-02-26 DIAGNOSIS — L578 Other skin changes due to chronic exposure to nonionizing radiation: Secondary | ICD-10-CM | POA: Diagnosis not present

## 2023-02-28 LAB — COLOGUARD: COLOGUARD: NEGATIVE

## 2023-03-02 ENCOUNTER — Telehealth: Payer: Self-pay

## 2023-03-02 NOTE — Telephone Encounter (Signed)
-----   Message from Neena Rhymes sent at 03/02/2023  7:30 AM EDT ----- Normal cologuard.  Great news!

## 2023-06-10 ENCOUNTER — Telehealth: Payer: Self-pay | Admitting: Family Medicine

## 2023-06-10 DIAGNOSIS — I341 Nonrheumatic mitral (valve) prolapse: Secondary | ICD-10-CM

## 2023-06-10 MED ORDER — AMOXICILLIN 500 MG PO CAPS: ORAL_CAPSULE | ORAL | 0 refills | Status: DC

## 2023-06-10 NOTE — Telephone Encounter (Signed)
Encourage patient to contact the pharmacy for refills or they can request refills through [facility]   WHAT PHARMACY WOULD THEY LIKE THIS SENT TO:  [facility] [address] [place]  MEDICATION NAME & DOSE: Amoxicillin 500mg     NOTES/COMMENTS FROM PATIENT: Pt states she must takes ABX before dental appt. Jun 30, 2023      Front office please notify patient: It takes 48-72 hours to process rx refill requests Ask patient to call pharmacy to ensure rx is ready before heading there.

## 2023-06-10 NOTE — Telephone Encounter (Signed)
Sent in Amoxicillin for her dental procedure, no concerns pt is well

## 2023-07-09 DIAGNOSIS — H524 Presbyopia: Secondary | ICD-10-CM | POA: Diagnosis not present

## 2023-07-09 DIAGNOSIS — H25043 Posterior subcapsular polar age-related cataract, bilateral: Secondary | ICD-10-CM | POA: Diagnosis not present

## 2023-07-09 DIAGNOSIS — H2513 Age-related nuclear cataract, bilateral: Secondary | ICD-10-CM | POA: Diagnosis not present

## 2023-07-09 DIAGNOSIS — H52203 Unspecified astigmatism, bilateral: Secondary | ICD-10-CM | POA: Diagnosis not present

## 2023-07-16 ENCOUNTER — Ambulatory Visit (INDEPENDENT_AMBULATORY_CARE_PROVIDER_SITE_OTHER): Payer: PPO | Admitting: Family Medicine

## 2023-07-16 ENCOUNTER — Encounter: Payer: Self-pay | Admitting: Family Medicine

## 2023-07-16 VITALS — BP 144/80 | HR 63 | Temp 97.7°F | Ht 65.0 in | Wt 138.0 lb

## 2023-07-16 DIAGNOSIS — E559 Vitamin D deficiency, unspecified: Secondary | ICD-10-CM | POA: Diagnosis not present

## 2023-07-16 DIAGNOSIS — M810 Age-related osteoporosis without current pathological fracture: Secondary | ICD-10-CM | POA: Diagnosis not present

## 2023-07-16 DIAGNOSIS — Z0000 Encounter for general adult medical examination without abnormal findings: Secondary | ICD-10-CM | POA: Diagnosis not present

## 2023-07-16 DIAGNOSIS — I341 Nonrheumatic mitral (valve) prolapse: Secondary | ICD-10-CM

## 2023-07-16 DIAGNOSIS — E785 Hyperlipidemia, unspecified: Secondary | ICD-10-CM | POA: Diagnosis not present

## 2023-07-16 LAB — BASIC METABOLIC PANEL
BUN: 17 mg/dL (ref 6–23)
CO2: 29 meq/L (ref 19–32)
Calcium: 9.6 mg/dL (ref 8.4–10.5)
Chloride: 100 meq/L (ref 96–112)
Creatinine, Ser: 0.74 mg/dL (ref 0.40–1.20)
GFR: 80.48 mL/min (ref >60.00)
Glucose, Bld: 92 mg/dL (ref 70–99)
Potassium: 4.5 meq/L (ref 3.5–5.1)
Sodium: 136 meq/L (ref 135–145)

## 2023-07-16 LAB — CBC WITH DIFFERENTIAL/PLATELET
Basophils Absolute: 0.1 10*3/uL (ref 0.0–0.1)
Basophils Relative: 0.9 % (ref 0.0–3.0)
Eosinophils Absolute: 0.2 10*3/uL (ref 0.0–0.7)
Eosinophils Relative: 2.9 % (ref 0.0–5.0)
HCT: 42.8 % (ref 36.0–46.0)
Hemoglobin: 14.8 g/dL (ref 12.0–15.0)
Lymphocytes Relative: 26.2 % (ref 12.0–46.0)
Lymphs Abs: 1.7 10*3/uL (ref 0.7–4.0)
MCHC: 34.6 g/dL (ref 30.0–36.0)
MCV: 96.9 fl (ref 78.0–100.0)
Monocytes Absolute: 0.6 10*3/uL (ref 0.1–1.0)
Monocytes Relative: 9.6 % (ref 3.0–12.0)
Neutro Abs: 4 10*3/uL (ref 1.4–7.7)
Neutrophils Relative %: 60.4 % (ref 43.0–77.0)
Platelets: 448 10*3/uL — ABNORMAL HIGH (ref 150.0–400.0)
RBC: 4.42 Mil/uL (ref 3.87–5.11)
RDW: 13.3 % (ref 11.5–15.5)
WBC: 6.6 10*3/uL (ref 4.0–10.5)

## 2023-07-16 LAB — LIPID PANEL
Cholesterol: 199 mg/dL (ref 0–200)
HDL: 75.8 mg/dL (ref >39.00)
LDL Cholesterol: 110 mg/dL — ABNORMAL HIGH (ref 0–99)
NonHDL: 123.45
Total CHOL/HDL Ratio: 3
Triglycerides: 65 mg/dL (ref 0.0–149.0)
VLDL: 13 mg/dL (ref 0.0–40.0)

## 2023-07-16 LAB — HEPATIC FUNCTION PANEL
ALT: 22 U/L (ref 0–35)
AST: 23 U/L (ref 0–37)
Albumin: 4.5 g/dL (ref 3.5–5.2)
Alkaline Phosphatase: 75 U/L (ref 39–117)
Bilirubin, Direct: 0.1 mg/dL (ref 0.0–0.3)
Total Bilirubin: 0.5 mg/dL (ref 0.2–1.2)
Total Protein: 7.4 g/dL (ref 6.0–8.3)

## 2023-07-16 LAB — TSH: TSH: 1.5 u[IU]/mL (ref 0.35–5.50)

## 2023-07-16 LAB — VITAMIN D 25 HYDROXY (VIT D DEFICIENCY, FRACTURES): VITD: 38.45 ng/mL (ref 30.00–100.00)

## 2023-07-16 MED ORDER — AMOXICILLIN 500 MG PO CAPS: ORAL_CAPSULE | ORAL | 0 refills | Status: AC

## 2023-07-16 NOTE — Assessment & Plan Note (Signed)
 Repeat DEXA ordered

## 2023-07-16 NOTE — Patient Instructions (Signed)
 Follow up in 1 year or as needed We'll notify you of your lab results and make any changes if needed Keep up the good work on healthy diet and regular exercise- you look great! We ordered the bone density so you should be able to call [facility] and add this on Call with any questions or concerns Stay Safe!  Stay Healthy! Happy Belated Birthday!!!

## 2023-07-16 NOTE — Assessment & Plan Note (Signed)
 Pt's PE WNL.  UTD on mammo, cologuard, PNA.  DEXA ordered.  Check labs.  Anticipatory guidance provided.

## 2023-07-16 NOTE — Progress Notes (Signed)
   Subjective:    Patient ID: Dawn Franco, female    DOB: January 14, 1951, 73 y.o.   MRN: [id number]  HPI CPE- UTD on mammo, cologuard, PNA  Patient Care Team    Relationship Specialty Notifications Start End  Sheliah Hatch, MD PCP - General Family Medicine  06/19/16   Antony Contras, MD Consulting Physician Ophthalmology  06/23/17   Jacqlyn Krauss, MD Referring Physician Dermatology  06/23/17   Shea Evans, MD Consulting Physician Obstetrics and Gynecology  06/23/17      Health Maintenance  Topic Date Due   DTaP/Tdap/Td (3 - Td or Tdap) 05/12/2016   Zoster Vaccines- Shingrix (2 of 2) 05/20/2018   COVID-19 Vaccine (5 - 2024-25 season) 01/11/2023   MAMMOGRAM  08/14/2023   Medicare Annual Wellness (AWV)  09/25/2023   Fecal DNA (Cologuard)  02/21/2026   Pneumonia Vaccine 18+ Years old  Completed   INFLUENZA VACCINE  Completed   DEXA SCAN  Completed   Hepatitis C Screening  Completed   HPV VACCINES  Aged Out      Review of Systems Patient reports no vision/ hearing changes, adenopathy,fever, weight change,  persistant/recurrent hoarseness , swallowing issues, chest pain, palpitations, edema, persistant/recurrent cough, hemoptysis, dyspnea (rest/exertional/paroxysmal nocturnal), gastrointestinal bleeding (melena, rectal bleeding), abdominal pain, significant heartburn, bowel changes, GU symptoms (dysuria, hematuria, incontinence), Gyn symptoms (abnormal  bleeding, pain),  syncope, focal weakness, memory loss, numbness & tingling, skin/hair/nail changes, abnormal bruising or bleeding, anxiety, or depression.     Objective:   Physical Exam General Appearance:    Alert, cooperative, no distress, appears stated age  Head:    Normocephalic, without obvious abnormality, atraumatic  Eyes:    PERRL, conjunctiva/corneas clear, EOM's intact both eyes  Ears:    Normal TM's and external ear canals, both ears  Nose:   Nares normal, septum midline, mucosa normal, no drainage    or  sinus tenderness  Throat:   Lips, mucosa, and tongue normal; teeth and gums normal  Neck:   Supple, symmetrical, trachea midline, no adenopathy;    Thyroid: no enlargement/tenderness/nodules  Back:     Symmetric, no curvature, ROM normal, no CVA tenderness  Lungs:     Clear to auscultation bilaterally, respirations unlabored  Chest Wall:    No tenderness or deformity   Heart:    Regular rate and rhythm, S1 and S2 normal, no murmur, rub   or gallop  Breast Exam:    Deferred to mammo  Abdomen:     Soft, non-tender, bowel sounds active all four quadrants,    no masses, no organomegaly  Genitalia:    Deferred   Rectal:    Extremities:   Extremities normal, atraumatic, no cyanosis or edema  Pulses:   2+ and symmetric all extremities  Skin:   Skin color, texture, turgor normal, no rashes or lesions  Lymph nodes:   Cervical, supraclavicular, and axillary nodes normal  Neurologic:   CNII-XII intact, normal strength, sensation and reflexes    throughout          Assessment & Plan:

## 2023-07-17 ENCOUNTER — Encounter: Payer: Self-pay | Admitting: Family Medicine

## 2023-07-17 NOTE — Telephone Encounter (Signed)
 Lab results have been discussed.   Verbalized understanding? Yes  Are there any questions? No

## 2023-07-17 NOTE — Telephone Encounter (Signed)
-----   Message from Neena Rhymes sent at 07/17/2023  4:21 PM EST ----- Labs look great!  No changes at this time

## 2023-07-27 ENCOUNTER — Telehealth: Payer: Self-pay | Admitting: Family Medicine

## 2023-07-27 NOTE — Telephone Encounter (Signed)
 Placed in folder at nurse station

## 2023-07-27 NOTE — Telephone Encounter (Signed)
 Type of form received: Physician Order for Bone Density  Additional comments:   Received by: [employer] - Front Desk  Form should be Faxed/mailed to: (address/ fax #) Fax to [phone]  Is patient requesting call for pickup: N/A  Form placed:  Labeled & placed in provider bin  Attach charge sheet.  Provider will determine charge.  Individual made aware of 3-5 business day turn around? N/A

## 2023-07-28 NOTE — Telephone Encounter (Signed)
 Form completed and returned to [place]

## 2023-07-28 NOTE — Telephone Encounter (Signed)
 Faxed and placed in scan bin

## 2023-08-05 NOTE — Telephone Encounter (Signed)
 Placed in your sign folder

## 2023-08-05 NOTE — Telephone Encounter (Signed)
 This form was faxed over again requiring a signature. Looked for the first order that was signed. It was sent to HIM on Friday so it's not in her chart yet. Form has charge sheet attached, has been labeled & placed in provider bin

## 2023-08-06 ENCOUNTER — Telehealth: Payer: Self-pay

## 2023-08-06 NOTE — Telephone Encounter (Signed)
 Type of form received:[facility] order request  Additional comments: noted as second request   Received by: fax  Form should be Faxed/mailed to: [phone]  Is patient requesting call for pickup:no  Form placed:  In Dr Neva Seat Sign folder, PCP out of office   Individual made aware of 3-5 business day turn around No?

## 2023-08-06 NOTE — Telephone Encounter (Signed)
 Faxed back.

## 2023-08-06 NOTE — Telephone Encounter (Signed)
 Paperwork completed and placed in fax bin at back nurse station

## 2023-08-10 NOTE — Telephone Encounter (Signed)
 Form completed and returned to [place]

## 2023-08-10 NOTE — Telephone Encounter (Signed)
 Faxed and placed in scan bin

## 2023-08-18 DIAGNOSIS — M810 Age-related osteoporosis without current pathological fracture: Secondary | ICD-10-CM | POA: Diagnosis not present

## 2023-08-18 DIAGNOSIS — Z1231 Encounter for screening mammogram for malignant neoplasm of breast: Secondary | ICD-10-CM | POA: Diagnosis not present

## 2023-08-18 LAB — HM DEXA SCAN

## 2023-08-18 LAB — HM MAMMOGRAPHY

## 2024-03-21 DIAGNOSIS — L821 Other seborrheic keratosis: Secondary | ICD-10-CM | POA: Diagnosis not present

## 2024-03-21 DIAGNOSIS — Z85828 Personal history of other malignant neoplasm of skin: Secondary | ICD-10-CM | POA: Diagnosis not present

## 2024-03-21 DIAGNOSIS — D225 Melanocytic nevi of trunk: Secondary | ICD-10-CM | POA: Diagnosis not present

## 2024-03-21 DIAGNOSIS — L814 Other melanin hyperpigmentation: Secondary | ICD-10-CM | POA: Diagnosis not present

## 2024-03-21 DIAGNOSIS — L578 Other skin changes due to chronic exposure to nonionizing radiation: Secondary | ICD-10-CM | POA: Diagnosis not present

## 2024-03-21 DIAGNOSIS — Z85820 Personal history of malignant melanoma of skin: Secondary | ICD-10-CM | POA: Diagnosis not present

## 2024-04-11 ENCOUNTER — Encounter: Payer: Self-pay | Admitting: Student in an Organized Health Care Education/Training Program

## 2024-04-11 ENCOUNTER — Ambulatory Visit (INDEPENDENT_AMBULATORY_CARE_PROVIDER_SITE_OTHER): Admitting: Student in an Organized Health Care Education/Training Program

## 2024-04-11 ENCOUNTER — Other Ambulatory Visit (HOSPITAL_COMMUNITY)
Admission: RE | Admit: 2024-04-11 | Discharge: 2024-04-11 | Disposition: A | Discharge status: Home / Self Care | Source: Ambulatory Visit | Attending: Student in an Organized Health Care Education/Training Program | Admitting: Student in an Organized Health Care Education/Training Program

## 2024-04-11 VITALS — BP 196/102 | Temp 97.6°F | Wt 140.0 lb

## 2024-04-11 DIAGNOSIS — R1031 Right lower quadrant pain: Secondary | ICD-10-CM | POA: Insufficient documentation

## 2024-04-11 DIAGNOSIS — R1032 Left lower quadrant pain: Secondary | ICD-10-CM

## 2024-04-11 DIAGNOSIS — I10 Essential (primary) hypertension: Secondary | ICD-10-CM | POA: Diagnosis not present

## 2024-04-11 DIAGNOSIS — R102 Pelvic and perineal pain unspecified side: Secondary | ICD-10-CM | POA: Diagnosis not present

## 2024-04-11 LAB — CBC WITH DIFFERENTIAL/PLATELET
Basophils Absolute: 0.1 K/uL (ref 0.0–0.1)
Basophils Relative: 0.7 % (ref 0.0–3.0)
Eosinophils Absolute: 0.1 K/uL (ref 0.0–0.7)
Eosinophils Relative: 1.4 % (ref 0.0–5.0)
HCT: 42.1 % (ref 36.0–46.0)
Hemoglobin: 14 g/dL (ref 12.0–15.0)
Lymphocytes Relative: 22.5 % (ref 12.0–46.0)
Lymphs Abs: 2 K/uL (ref 0.7–4.0)
MCHC: 33.4 g/dL (ref 30.0–36.0)
MCV: 97.8 fl (ref 78.0–100.0)
Monocytes Absolute: 0.8 K/uL (ref 0.1–1.0)
Monocytes Relative: 8.4 % (ref 3.0–12.0)
Neutro Abs: 6 K/uL (ref 1.4–7.7)
Neutrophils Relative %: 67 % (ref 43.0–77.0)
Platelets: 467 K/uL — ABNORMAL HIGH (ref 150.0–400.0)
RBC: 4.3 Mil/uL (ref 3.87–5.11)
RDW: 13.7 % (ref 11.5–15.5)
WBC: 9 K/uL (ref 4.0–10.5)

## 2024-04-11 LAB — COMPREHENSIVE METABOLIC PANEL WITH GFR
ALT: 15 U/L (ref 0–35)
AST: 19 U/L (ref 0–37)
Albumin: 4.4 g/dL (ref 3.5–5.2)
Alkaline Phosphatase: 77 U/L (ref 39–117)
BUN: 14 mg/dL (ref 6–23)
CO2: 31 meq/L (ref 19–32)
Calcium: 9.7 mg/dL (ref 8.4–10.5)
Chloride: 99 meq/L (ref 96–112)
Creatinine, Ser: 0.81 mg/dL (ref 0.40–1.20)
GFR: 71.83 mL/min (ref >60.00)
Glucose, Bld: 97 mg/dL (ref 70–99)
Potassium: 4.6 meq/L (ref 3.5–5.1)
Sodium: 136 meq/L (ref 135–145)
Total Bilirubin: 0.8 mg/dL (ref 0.2–1.2)
Total Protein: 7.7 g/dL (ref 6.0–8.3)

## 2024-04-11 NOTE — Progress Notes (Signed)
 Acute Office Visit  Patient ID: Dawn Franco, female    DOB: 1950-06-22, 73 y.o.   MRN: [id number]  PCP: Mahlon Comer BRAVO, MD  Chief Complaint  Patient presents with   Fever    Saturday lower abdominal pain that is not subsiding and now a fever of 100.9 today.     Subjective:     HPI  Discussed the use of AI scribe software for clinical note transcription with the patient, who gave verbal consent to proceed.  History of Present Illness Dawn Franco is a 73 year old female who presents with abdominal pain.  She has been experiencing abdominal pain since Saturday, primarily located in the lower abdomen. The pain was most severe on Saturday night, initially thought to be indigestion, but has slightly improved since then. No nausea, vomiting, or changes in bowel movements, which remain normal. No pain during urination or burning sensations.  She has a history of similar abdominal pain associated with bladder infections and once with a vaginal yeast infection. She recalls a past episode of diverticulitis, though it is not frequent. No recent antibiotic use except for a single dose of amoxicillin  on November 19th for dental prophylaxis.  She reports a fever of 100F this morning, though she did not check her temperature over the weekend. No respiratory symptoms except for an occasional cough.  She had a colonoscopy approximately ten years ago, which was normal, and has done Cologuard tests since then. She occasionally uses estradiol, which she associates with past yeast infections.      Objective:    BP (!) 200/101   Temp 97.6 F (36.4 C) (Temporal)   Wt 140 lb (63.5 kg)   LMP 05/12/2001   BMI 23.30 kg/m   Physical Exam  Gen: Well-appearing woman Heart: Regular, no murmur Lungs: Unlabored, clear throughout Abd: Soft, moderate tenderness in the left lower quadrants, right lower quadrant, and suprapubic area.  No rebound.  No guarding.  Ext: Warm, no  edema     Assessment & Plan:   Problem List Items Addressed This Visit       Unprioritized   Bilateral lower abdominal discomfort - Primary   She experiences intermittent lower abdominal pain with a low-grade fever. The differential diagnosis includes diverticulitis, colitis, acute cystitis, or vaginitis. Recent amoxicillin  use is noted, I think she is low risk for C. difficile infection given only 1 dose of amoxicillin  over 10 days ago and she has had no loose stools/diarrhea. Mild diverticulitis or vaginal infection is considered based on her history. Blood work and a urine sample are ordered. A vaginal swab is collected for yeast infection testing. A CT scan will be considered if symptoms persist. No further imaging is needed if symptoms resolve on their own.  If she develops diarrhea, would want to check for C. difficile given recent antibiotic.      Relevant Orders   Cervicovaginal ancillary only   CBC with Differential/Platelet   Comprehensive metabolic panel with GFR   Urinalysis, Routine w reflex microscopic   Urine Culture   Hypertension   Severe asymptomatic hypertension today with blood pressure 200/101.  This is in the setting of an acute illness with lower abdominal discomfort, but her exam otherwise is reassuring.  She has had elevated blood pressures over the last year that seem to have been increasing.  We are investigating the acute lower abdominal discomfort, if she has persistent elevated blood pressures after this illness resolves, may benefit from antihypertensive.  Return if symptoms worsen or fail to improve.  Cleatus Debby Specking, MD [facility]

## 2024-04-11 NOTE — Assessment & Plan Note (Signed)
 Severe asymptomatic hypertension today with blood pressure 200/101.  This is in the setting of an acute illness with lower abdominal discomfort, but her exam otherwise is reassuring.  She has had elevated blood pressures over the last year that seem to have been increasing.  We are investigating the acute lower abdominal discomfort, if she has persistent elevated blood pressures after this illness resolves, may benefit from antihypertensive.

## 2024-04-11 NOTE — Patient Instructions (Signed)
  VISIT SUMMARY: You came in today because of abdominal pain that started on Saturday. The pain was most severe on Saturday night but has slightly improved since then. You also reported a low-grade fever this morning. You have a history of similar pain with bladder infections and once with a vaginal yeast infection. You had a normal colonoscopy about ten years ago and have done Cologuard tests since then. You occasionally use estradiol, which you associate with past yeast infections.  YOUR PLAN: -LOWER ABDOMINAL PAIN AND LOW-GRADE FEVER: Your symptoms of lower abdominal pain and a low-grade fever could be due to diverticulitis, a bladder infection, or a vaginal yeast infection. We have ordered blood work and a urine sample to help determine the cause. We also collected a vaginal swab to test for a yeast infection. If your symptoms persist, we may consider a CT scan. If your symptoms improve, no further imaging will be needed.  INSTRUCTIONS: Please follow up with us  after your blood work, urine sample, and vaginal swab results are available. If your symptoms worsen or do not improve, contact us  immediately. If your symptoms resolve, no further action is needed.

## 2024-04-11 NOTE — Assessment & Plan Note (Addendum)
 She experiences intermittent lower abdominal pain with a low-grade fever. The differential diagnosis includes diverticulitis, colitis, acute cystitis, or vaginitis. Recent amoxicillin  use is noted, I think she is low risk for C. difficile infection given only 1 dose of amoxicillin  over 10 days ago and she has had no loose stools/diarrhea. Mild diverticulitis or vaginal infection is considered based on her history. Blood work and a urine sample are ordered. A vaginal swab is collected for yeast infection testing. A CT scan will be considered if symptoms persist. No further imaging is needed if symptoms resolve on their own.  If she develops diarrhea, would want to check for C. difficile given recent antibiotic.

## 2024-04-12 ENCOUNTER — Ambulatory Visit: Payer: Self-pay | Admitting: Student in an Organized Health Care Education/Training Program

## 2024-04-12 LAB — URINALYSIS, ROUTINE W REFLEX MICROSCOPIC
Bilirubin Urine: NEGATIVE
Ketones, ur: NEGATIVE
Nitrite: NEGATIVE
Specific Gravity, Urine: <=1.005 — AB (ref 1.000–1.030)
Total Protein, Urine: NEGATIVE
Urine Glucose: NEGATIVE
Urobilinogen, UA: 0.2 (ref 0.0–1.0)
pH: 6.5 (ref 5.0–8.0)

## 2024-04-12 LAB — URINE CULTURE
MICRO NUMBER:: 17295820
Result:: NO GROWTH
SPECIMEN QUALITY:: ADEQUATE

## 2024-04-13 LAB — CERVICOVAGINAL ANCILLARY ONLY
Bacterial Vaginitis (gardnerella): POSITIVE — AB
Candida Glabrata: NEGATIVE
Candida Vaginitis: NEGATIVE
Chlamydia: NEGATIVE
Comment: NEGATIVE
Comment: NEGATIVE
Comment: NEGATIVE
Comment: NEGATIVE
Comment: NEGATIVE
Comment: NORMAL
Neisseria Gonorrhea: NEGATIVE
Trichomonas: NEGATIVE

## 2024-04-14 MED ORDER — METRONIDAZOLE 0.75 % EX GEL: 1.0000 | Freq: Every day | CUTANEOUS | 0 refills | Status: DC

## 2024-04-14 NOTE — Addendum Note (Signed)
 Addended by: JERRELL SOLIAN T on: 04/14/2024 07:48 AM   Modules accepted: Orders

## 2024-04-15 ENCOUNTER — Other Ambulatory Visit: Payer: Self-pay | Admitting: Student in an Organized Health Care Education/Training Program

## 2024-04-15 NOTE — Telephone Encounter (Signed)
 Pharmacy said Alternative Requested:THIS IS FOR TOPICALLY. NEEDS FOR VAGINALLY.

## 2024-07-19 ENCOUNTER — Encounter: Admitting: Family Medicine
